# Patient Record
Sex: Male | Born: 1955 | Race: White | Hispanic: No | Marital: Married | Smoking: Never smoker
Health system: Southern US, Community
[De-identification: ages and names within clinical notes are randomized; demographics above are authoritative.]

## PROBLEM LIST (undated history)

## (undated) DIAGNOSIS — K219 Gastro-esophageal reflux disease without esophagitis: Secondary | ICD-10-CM

## (undated) DIAGNOSIS — R112 Nausea with vomiting, unspecified: Secondary | ICD-10-CM

## (undated) DIAGNOSIS — M109 Gout, unspecified: Secondary | ICD-10-CM

## (undated) DIAGNOSIS — L309 Dermatitis, unspecified: Secondary | ICD-10-CM

## (undated) DIAGNOSIS — R351 Nocturia: Secondary | ICD-10-CM

## (undated) DIAGNOSIS — M75101 Unspecified rotator cuff tear or rupture of right shoulder, not specified as traumatic: Secondary | ICD-10-CM

## (undated) DIAGNOSIS — S43439A Superior glenoid labrum lesion of unspecified shoulder, initial encounter: Secondary | ICD-10-CM

## (undated) DIAGNOSIS — Z973 Presence of spectacles and contact lenses: Secondary | ICD-10-CM

## (undated) DIAGNOSIS — M1990 Unspecified osteoarthritis, unspecified site: Secondary | ICD-10-CM

## (undated) DIAGNOSIS — Z8619 Personal history of other infectious and parasitic diseases: Secondary | ICD-10-CM

## (undated) DIAGNOSIS — J45990 Exercise induced bronchospasm: Secondary | ICD-10-CM

## (undated) DIAGNOSIS — I10 Essential (primary) hypertension: Secondary | ICD-10-CM

## (undated) DIAGNOSIS — Z9889 Other specified postprocedural states: Secondary | ICD-10-CM

## (undated) DIAGNOSIS — N400 Enlarged prostate without lower urinary tract symptoms: Secondary | ICD-10-CM

## (undated) DIAGNOSIS — J189 Pneumonia, unspecified organism: Secondary | ICD-10-CM

## (undated) HISTORY — PX: TOTAL KNEE ARTHROPLASTY: SHX125

## (undated) HISTORY — PX: OTHER SURGICAL HISTORY: SHX169

## (undated) HISTORY — PX: CYST REMOVAL LEG: SHX6280

## (undated) HISTORY — PX: CYSTOSCOPY WITH INSERTION OF UROLIFT: SHX6678

## (undated) HISTORY — PX: SHOULDER ARTHROSCOPY WITH ROTATOR CUFF REPAIR: SHX5685

---

## 1978-09-03 HISTORY — PX: KNEE ARTHROSCOPY: SUR90

## 1999-07-20 ENCOUNTER — Ambulatory Visit (HOSPITAL_COMMUNITY): Admission: RE | Admit: 1999-07-20 | Discharge: 1999-07-20 | Payer: Self-pay | Admitting: Gastroenterology

## 1999-11-04 ENCOUNTER — Ambulatory Visit (HOSPITAL_COMMUNITY): Admission: RE | Admit: 1999-11-04 | Discharge: 1999-11-04 | Payer: Self-pay | Admitting: Cardiovascular Disease

## 2011-09-15 ENCOUNTER — Other Ambulatory Visit: Payer: Self-pay | Admitting: Gastroenterology

## 2011-09-15 DIAGNOSIS — R1011 Right upper quadrant pain: Secondary | ICD-10-CM

## 2011-09-19 ENCOUNTER — Ambulatory Visit
Admission: RE | Admit: 2011-09-19 | Discharge: 2011-09-19 | Disposition: A | Discharge status: Home / Self Care | Payer: Commercial Managed Care - PPO | Source: Ambulatory Visit | Attending: Gastroenterology | Admitting: Gastroenterology

## 2011-09-19 DIAGNOSIS — R1011 Right upper quadrant pain: Secondary | ICD-10-CM

## 2011-11-28 ENCOUNTER — Other Ambulatory Visit: Payer: Self-pay | Admitting: Gastroenterology

## 2011-11-28 DIAGNOSIS — R197 Diarrhea, unspecified: Secondary | ICD-10-CM

## 2011-11-28 DIAGNOSIS — R1011 Right upper quadrant pain: Secondary | ICD-10-CM

## 2011-11-28 DIAGNOSIS — K8689 Other specified diseases of pancreas: Secondary | ICD-10-CM

## 2011-12-11 ENCOUNTER — Ambulatory Visit
Admission: RE | Admit: 2011-12-11 | Discharge: 2011-12-11 | Disposition: A | Discharge status: Home / Self Care | Payer: Commercial Managed Care - PPO | Source: Ambulatory Visit | Attending: Gastroenterology | Admitting: Gastroenterology

## 2011-12-11 DIAGNOSIS — K8689 Other specified diseases of pancreas: Secondary | ICD-10-CM

## 2011-12-11 DIAGNOSIS — R197 Diarrhea, unspecified: Secondary | ICD-10-CM

## 2011-12-11 DIAGNOSIS — R1011 Right upper quadrant pain: Secondary | ICD-10-CM

## 2011-12-11 MED ORDER — IOHEXOL 300 MG/ML  SOLN
100.0000 mL | Freq: Once | INTRAMUSCULAR | Status: AC | PRN
  Administered 2011-12-11: 100 mL via INTRAVENOUS

## 2013-08-31 IMAGING — CT CT ABD-PEL WO/W CM
1 of 8 series · 12 of 36 positions shown, 18 images · IV contrast (WATER & [ID] OMNI 300)
Comparison: Ultrasound of the abdomen of 09/19/2011

CLINICAL DATA: Right upper quadrant abdominal pain, slightly
echogenic focus within the tail of pancreas on ultrasound.
Diarrhea

CT ABDOMEN AND PELVIS WITHOUT AND WITH CONTRAST
TECHNIQUE: Multidetector CT imaging of the abdomen and pelvis was
performed without contrast material in one or both body regions,
followed by contrast material(s) and further sections in one or
both body regions.
Contrast: 100mL OMNIPAQUE IOHEXOL 300 MG/ML  SOLN

[Series 4: arterial/port venous (id) · axial · arterial · 0.72mm/px · z∈[-347,-14]mm · 12 of 316 slices shown, 18 images]
[im 25/316  soft-tissue]
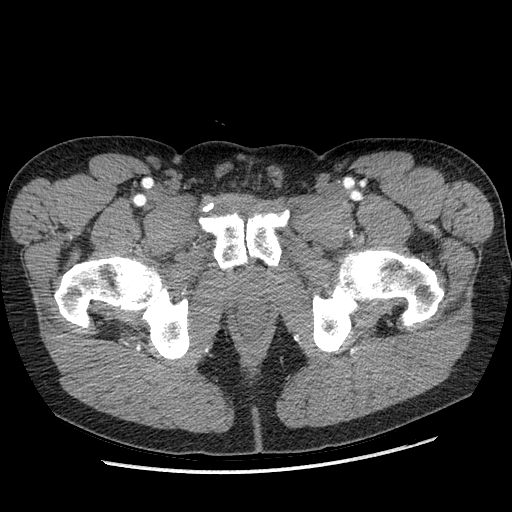
[im 25/316  bone]
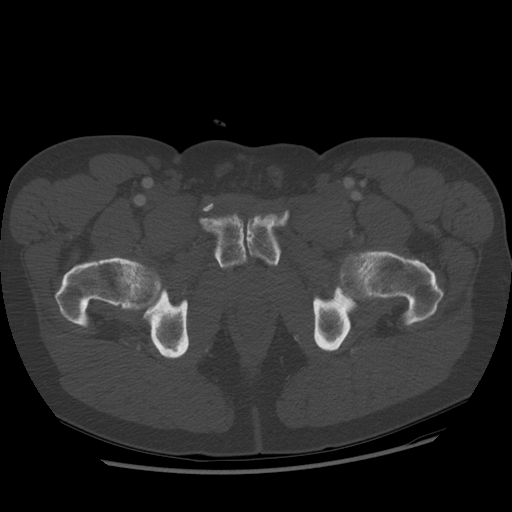
[im 49/316  soft-tissue]
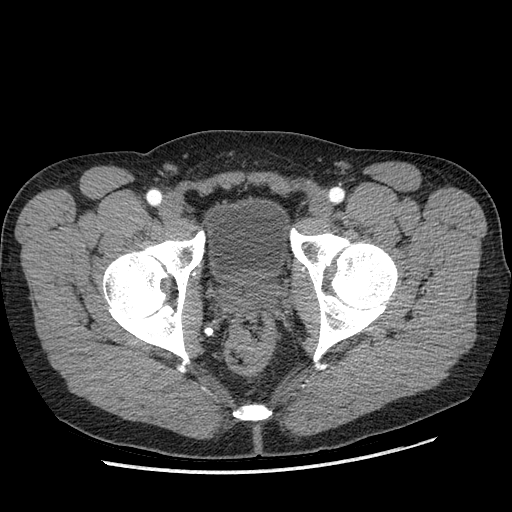
[im 73/316  soft-tissue]
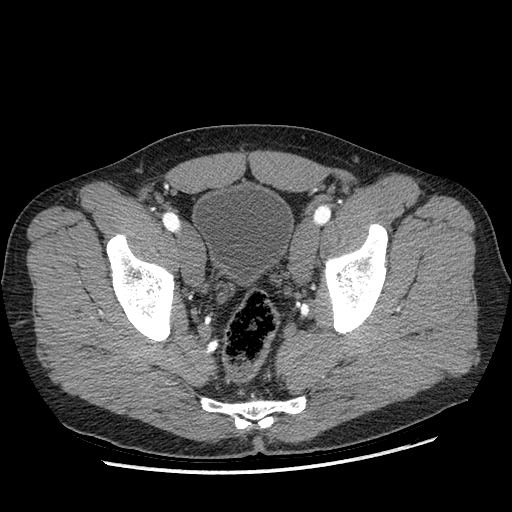
[im 97/316  soft-tissue]
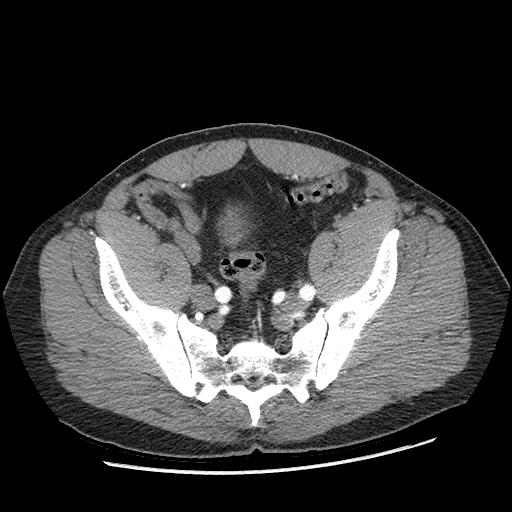
[im 122/316  soft-tissue]
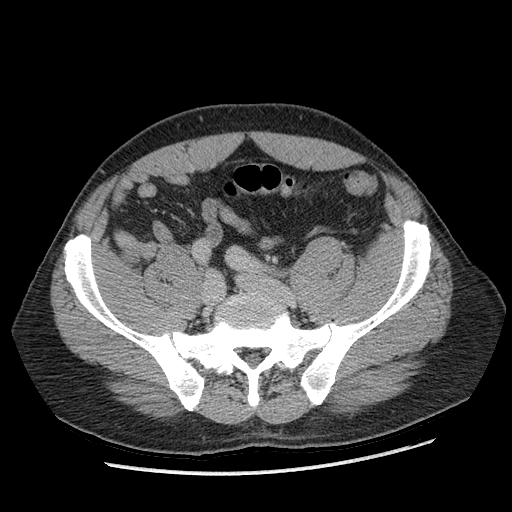
[im 146/316  soft-tissue]
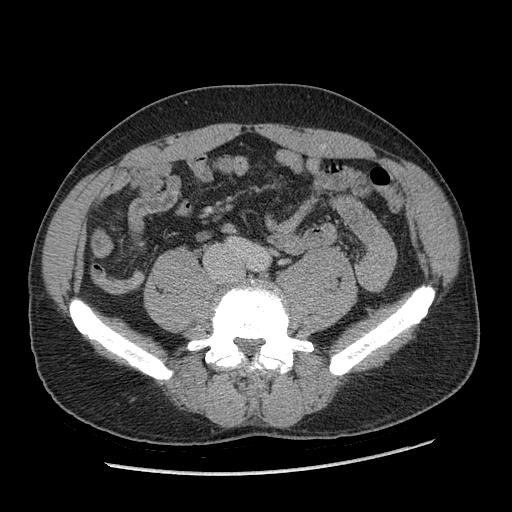
[im 170/316  soft-tissue]
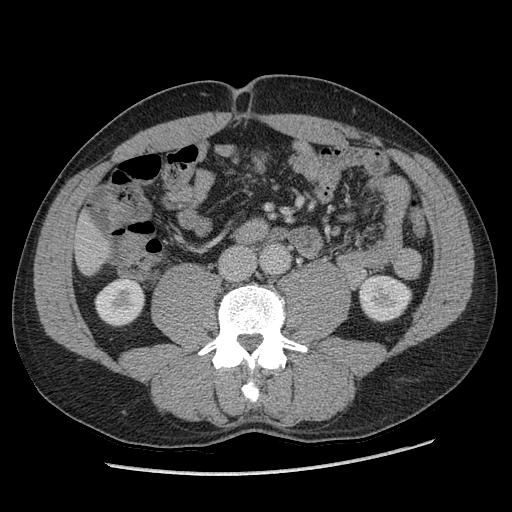
[im 194/316  soft-tissue]
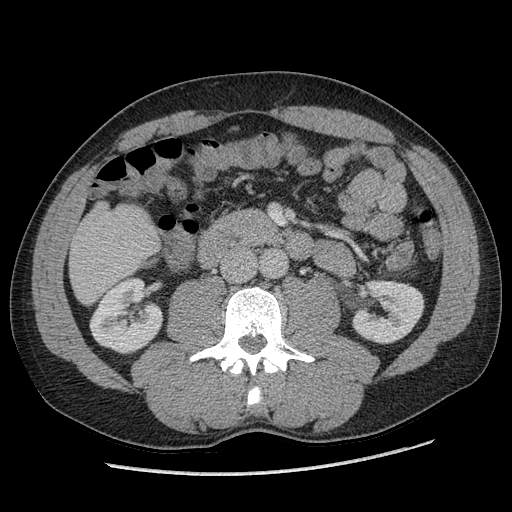
[im 219/316  soft-tissue]
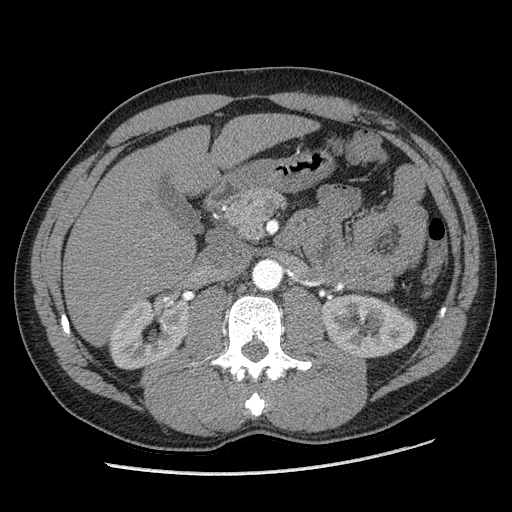
[im 219/316  lung]
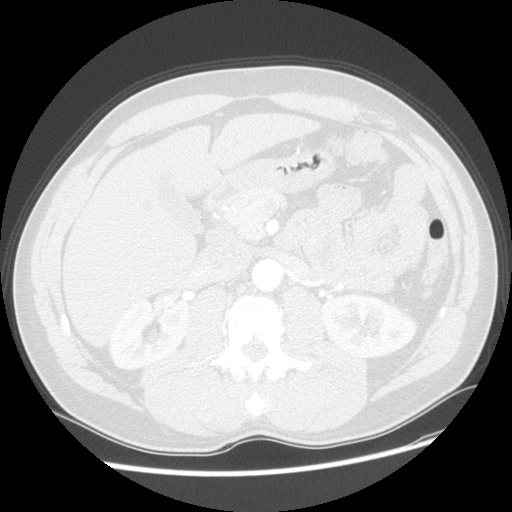
[im 219/316  bone]
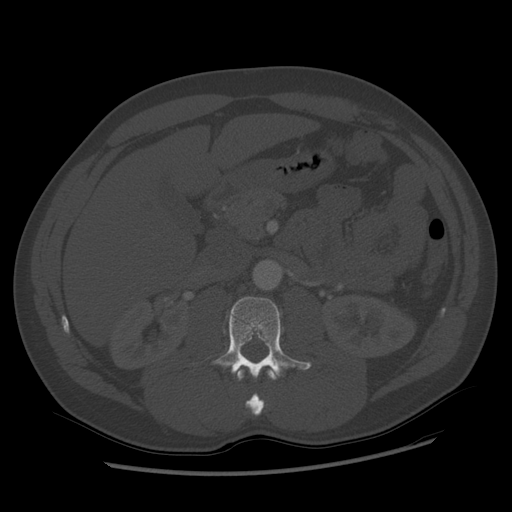
[im 243/316  soft-tissue]
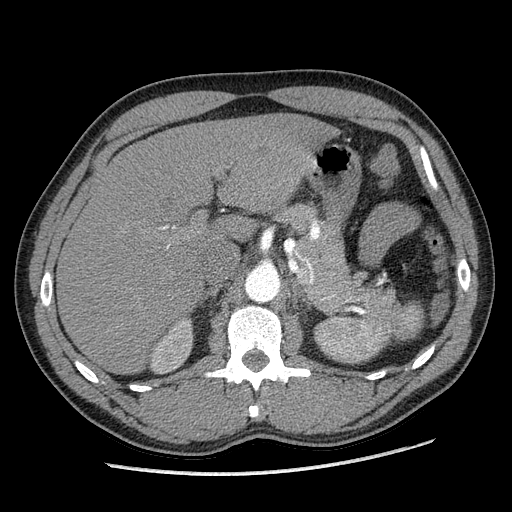
[im 243/316  lung]
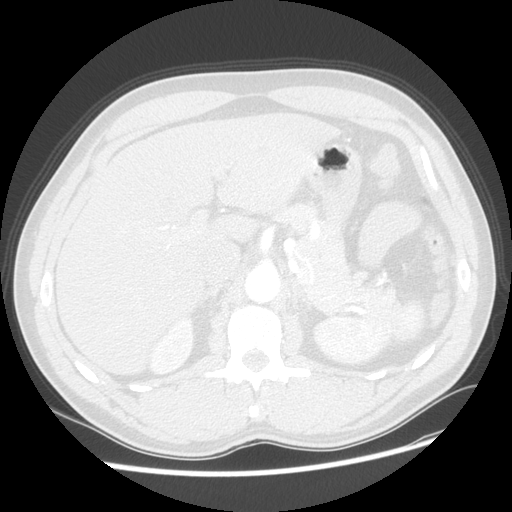
[im 267/316  soft-tissue]
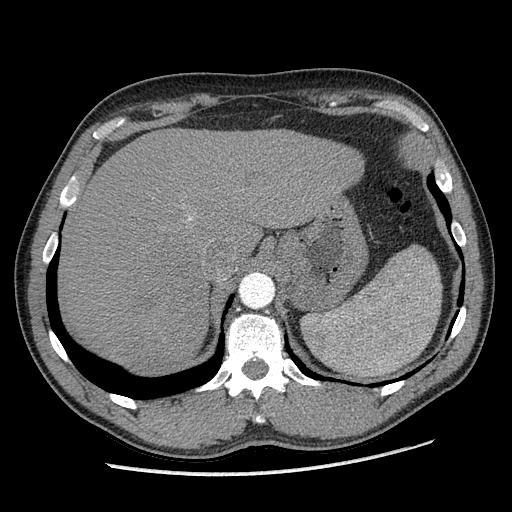
[im 267/316  lung]
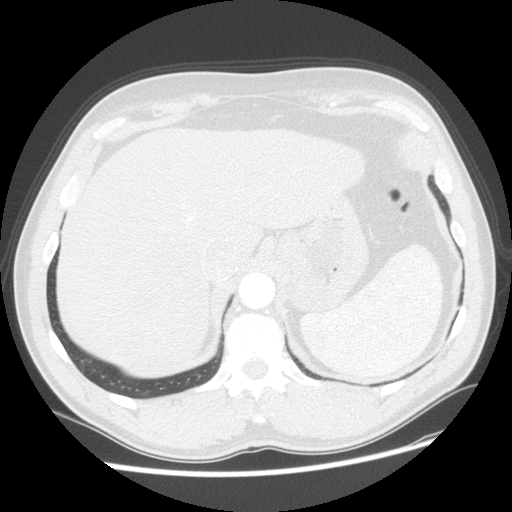
[im 291/316  soft-tissue]
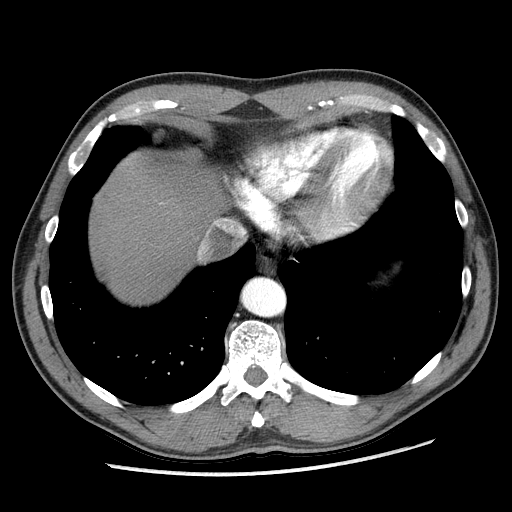
[im 291/316  lung]
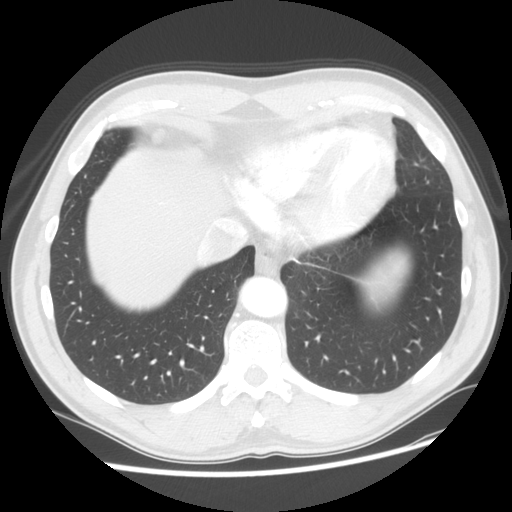

[12 of 36 positions shown; findings below may reference images not displayed]

FINDINGS: The lung bases are clear.  On the unenhanced study there
is mild cardiomegaly present.  There may also be a small hiatal
hernia.  There is a small nonobstructing right lower pole renal
calculus present.

On the arterial phase, no enhancing pancreatic lesion is seen.  The
liver enhances with no focal abnormality and no ductal dilatation
is seen.  No calcified gallstones are noted.  The pancreas
homogeneously enhances with no focal abnormality and no pancreatic
ductal dilatation is seen.  The origins of the celiac axis and SMA
appear patent.  The kidneys enhance with no suspicious abnormality
noted.  The abdominal aorta is normal in caliber.  No adenopathy is
seen.

On the portal venous phase, no pancreatic lesion is evident.  The
pancreatic duct is not dilated.

The urinary bladder is not well distended and is minimally thick-
walled.  The prostate is within normal limits in size.  No pelvic
mass or fluid is seen.  No abnormality of the colon is noted.  The
terminal ileum and the appendix are unremarkable.  On bone window
images there is some sclerosis along the SI joints which most
likely is degenerative in origin.  Sacroiliitis is difficult to
exclude. There is degenerative change throughout the facet joints
of the lower lumbar spine.  There is a questionable minimal pars
defect on the right at L5 but normal alignment of the lumbar
vertebrae is noted.
IMPRESSION: 1.  No pancreatic lesion is seen.
2.  No explanation for the patient's right upper quadrant pain is
noted.
3.  Small hiatal hernia.
4.  Mild cardiomegaly.
5.  Sclerosis along the SI joints most likely is degenerative in
origin.  Sacroiliitis cannot be excluded.

## 2015-05-26 ENCOUNTER — Other Ambulatory Visit: Payer: Self-pay | Admitting: Orthopedic Surgery

## 2015-05-26 ENCOUNTER — Encounter (HOSPITAL_BASED_OUTPATIENT_CLINIC_OR_DEPARTMENT_OTHER): Payer: Self-pay | Admitting: *Deleted

## 2015-05-26 NOTE — Progress Notes (Signed)
NPO AFTER MN, PT VERBALIZED UNDERSTANDING THIS INCLUDES NO DIP TOBACCO.   ARRIVE AT 0900.  NEEDS ISTAT AND EKG.  WILL TAKE RAPAFLO AND LOSARTAN AM DOS W/ SIPS OF WATER . 

## 2015-05-27 ENCOUNTER — Encounter (HOSPITAL_BASED_OUTPATIENT_CLINIC_OR_DEPARTMENT_OTHER): Payer: Self-pay | Admitting: Anesthesiology

## 2015-05-27 ENCOUNTER — Ambulatory Visit (HOSPITAL_BASED_OUTPATIENT_CLINIC_OR_DEPARTMENT_OTHER)
Admission: RE | Admit: 2015-05-27 | Discharge: 2015-05-27 | Disposition: A | Discharge status: Home / Self Care | Payer: Commercial Managed Care - PPO | Source: Ambulatory Visit | Attending: Specialist | Admitting: Specialist

## 2015-05-27 ENCOUNTER — Ambulatory Visit (HOSPITAL_BASED_OUTPATIENT_CLINIC_OR_DEPARTMENT_OTHER): Payer: Commercial Managed Care - PPO | Admitting: Anesthesiology

## 2015-05-27 ENCOUNTER — Encounter (HOSPITAL_BASED_OUTPATIENT_CLINIC_OR_DEPARTMENT_OTHER)
Admission: RE | Disposition: A | Discharge status: Home / Self Care | Payer: Self-pay | Source: Ambulatory Visit | Attending: Specialist

## 2015-05-27 DIAGNOSIS — I10 Essential (primary) hypertension: Secondary | ICD-10-CM | POA: Diagnosis not present

## 2015-05-27 DIAGNOSIS — M19011 Primary osteoarthritis, right shoulder: Secondary | ICD-10-CM | POA: Insufficient documentation

## 2015-05-27 DIAGNOSIS — X58XXXA Exposure to other specified factors, initial encounter: Secondary | ICD-10-CM | POA: Diagnosis not present

## 2015-05-27 DIAGNOSIS — M75121 Complete rotator cuff tear or rupture of right shoulder, not specified as traumatic: Secondary | ICD-10-CM | POA: Insufficient documentation

## 2015-05-27 DIAGNOSIS — Z96653 Presence of artificial knee joint, bilateral: Secondary | ICD-10-CM | POA: Diagnosis not present

## 2015-05-27 DIAGNOSIS — Z9889 Other specified postprocedural states: Secondary | ICD-10-CM

## 2015-05-27 DIAGNOSIS — S46111A Strain of muscle, fascia and tendon of long head of biceps, right arm, initial encounter: Secondary | ICD-10-CM | POA: Insufficient documentation

## 2015-05-27 DIAGNOSIS — M170 Bilateral primary osteoarthritis of knee: Secondary | ICD-10-CM | POA: Diagnosis not present

## 2015-05-27 DIAGNOSIS — S43401A Unspecified sprain of right shoulder joint, initial encounter: Secondary | ICD-10-CM | POA: Insufficient documentation

## 2015-05-27 DIAGNOSIS — M25519 Pain in unspecified shoulder: Secondary | ICD-10-CM | POA: Diagnosis present

## 2015-05-27 DIAGNOSIS — F17290 Nicotine dependence, other tobacco product, uncomplicated: Secondary | ICD-10-CM | POA: Insufficient documentation

## 2015-05-27 HISTORY — DX: Nocturia: R35.1

## 2015-05-27 HISTORY — PX: SHOULDER ARTHROSCOPY WITH ROTATOR CUFF REPAIR AND SUBACROMIAL DECOMPRESSION: SHX5686

## 2015-05-27 HISTORY — DX: Exercise induced bronchospasm: J45.990

## 2015-05-27 HISTORY — DX: Dermatitis, unspecified: L30.9

## 2015-05-27 HISTORY — DX: Unspecified rotator cuff tear or rupture of right shoulder, not specified as traumatic: M75.101

## 2015-05-27 HISTORY — DX: Benign prostatic hyperplasia without lower urinary tract symptoms: N40.0

## 2015-05-27 HISTORY — DX: Unspecified osteoarthritis, unspecified site: M19.90

## 2015-05-27 HISTORY — DX: Presence of spectacles and contact lenses: Z97.3

## 2015-05-27 HISTORY — DX: Nausea with vomiting, unspecified: R11.2

## 2015-05-27 HISTORY — DX: Essential (primary) hypertension: I10

## 2015-05-27 HISTORY — DX: Superior glenoid labrum lesion of unspecified shoulder, initial encounter: S43.439A

## 2015-05-27 HISTORY — DX: Gout, unspecified: M10.9

## 2015-05-27 HISTORY — DX: Personal history of other infectious and parasitic diseases: Z86.19

## 2015-05-27 HISTORY — DX: Other specified postprocedural states: Z98.890

## 2015-05-27 LAB — POCT I-STAT 4, (NA,K, GLUC, HGB,HCT)
Glucose, Bld: 104 mg/dL — ABNORMAL HIGH (ref 65–99)
HCT: 46 % (ref 39.0–52.0)
HEMOGLOBIN: 15.6 g/dL (ref 13.0–17.0)
Potassium: 4.5 mmol/L (ref 3.5–5.1)
SODIUM: 140 mmol/L (ref 135–145)

## 2015-05-27 SURGERY — SHOULDER ARTHROSCOPY WITH ROTATOR CUFF REPAIR AND SUBACROMIAL DECOMPRESSION
Anesthesia: General | Site: Shoulder | Laterality: Right

## 2015-05-27 MED ORDER — ONDANSETRON HCL 4 MG/2ML IJ SOLN
INTRAMUSCULAR | Status: AC
  Filled 2015-05-27: qty 2

## 2015-05-27 MED ORDER — PROPOFOL 10 MG/ML IV BOLUS
INTRAVENOUS | Status: DC | PRN
  Administered 2015-05-27: 160 mg via INTRAVENOUS

## 2015-05-27 MED ORDER — CEFAZOLIN SODIUM-DEXTROSE 2-4 GM/100ML-% IV SOLN
2.0000 g | INTRAVENOUS | Status: AC
  Administered 2015-05-27: 2 g via INTRAVENOUS
  Filled 2015-05-27: qty 100

## 2015-05-27 MED ORDER — LACTATED RINGERS IV SOLN
INTRAVENOUS | Status: DC
Start: 2015-05-27 — End: 2015-05-27
  Administered 2015-05-27 (×2): via INTRAVENOUS
  Filled 2015-05-27: qty 1000

## 2015-05-27 MED ORDER — FENTANYL CITRATE (PF) 100 MCG/2ML IJ SOLN
25.0000 ug | Freq: Once | INTRAMUSCULAR | Status: AC
  Administered 2015-05-27: 25 ug via INTRAVENOUS
  Filled 2015-05-27: qty 0.5

## 2015-05-27 MED ORDER — BUPIVACAINE HCL (PF) 0.25 % IJ SOLN: INTRAMUSCULAR | Status: DC | PRN

## 2015-05-27 MED ORDER — FENTANYL CITRATE (PF) 100 MCG/2ML IJ SOLN
INTRAMUSCULAR | Status: AC
  Filled 2015-05-27: qty 2

## 2015-05-27 MED ORDER — ONDANSETRON HCL 4 MG/2ML IJ SOLN
INTRAMUSCULAR | Status: DC | PRN
  Administered 2015-05-27: 4 mg via INTRAVENOUS

## 2015-05-27 MED ORDER — PROPOFOL 10 MG/ML IV BOLUS
INTRAVENOUS | Status: AC
  Filled 2015-05-27: qty 40

## 2015-05-27 MED ORDER — PROMETHAZINE HCL 25 MG/ML IJ SOLN
6.2500 mg | INTRAMUSCULAR | Status: DC | PRN
  Filled 2015-05-27: qty 1

## 2015-05-27 MED ORDER — METHOCARBAMOL 500 MG PO TABS: 500.0000 mg | ORAL_TABLET | Freq: Four times a day (QID) | ORAL | Status: DC | PRN

## 2015-05-27 MED ORDER — DEXAMETHASONE SODIUM PHOSPHATE 4 MG/ML IJ SOLN
INTRAMUSCULAR | Status: DC | PRN
  Administered 2015-05-27: 10 mg via INTRAVENOUS

## 2015-05-27 MED ORDER — MIDAZOLAM HCL 2 MG/2ML IJ SOLN
INTRAMUSCULAR | Status: AC
  Filled 2015-05-27: qty 2

## 2015-05-27 MED ORDER — KETOROLAC TROMETHAMINE 30 MG/ML IJ SOLN
INTRAMUSCULAR | Status: AC
  Filled 2015-05-27: qty 1

## 2015-05-27 MED ORDER — OXYCODONE-ACETAMINOPHEN 5-325 MG PO TABS: 1.0000 | ORAL_TABLET | ORAL | Status: DC | PRN

## 2015-05-27 MED ORDER — DEXAMETHASONE SODIUM PHOSPHATE 10 MG/ML IJ SOLN
INTRAMUSCULAR | Status: AC
  Filled 2015-05-27: qty 1

## 2015-05-27 MED ORDER — SUCCINYLCHOLINE CHLORIDE 20 MG/ML IJ SOLN
INTRAMUSCULAR | Status: DC | PRN
  Administered 2015-05-27: 100 mg via INTRAVENOUS

## 2015-05-27 MED ORDER — BUPIVACAINE-EPINEPHRINE (PF) 0.5% -1:200000 IJ SOLN
INTRAMUSCULAR | Status: DC | PRN
  Administered 2015-05-27: 25 mL

## 2015-05-27 MED ORDER — CEFAZOLIN SODIUM-DEXTROSE 2-4 GM/100ML-% IV SOLN
INTRAVENOUS | Status: AC
  Filled 2015-05-27: qty 100

## 2015-05-27 MED ORDER — SODIUM CHLORIDE 0.9 % IR SOLN
Status: DC | PRN
  Administered 2015-05-27: 24000 mL

## 2015-05-27 MED ORDER — CEPHALEXIN 500 MG PO CAPS
500.0000 mg | ORAL_CAPSULE | Freq: Three times a day (TID) | ORAL | Status: DC
Start: 2015-05-27 — End: 2020-06-23

## 2015-05-27 MED ORDER — BUPIVACAINE-EPINEPHRINE (PF) 0.5% -1:200000 IJ SOLN
INTRAMUSCULAR | Status: AC
  Filled 2015-05-27: qty 30

## 2015-05-27 MED ORDER — MIDAZOLAM HCL 2 MG/2ML IJ SOLN
2.0000 mg | Freq: Once | INTRAMUSCULAR | Status: AC
  Administered 2015-05-27: 2 mg via INTRAVENOUS
  Filled 2015-05-27: qty 2

## 2015-05-27 MED ORDER — FENTANYL CITRATE (PF) 100 MCG/2ML IJ SOLN
25.0000 ug | INTRAMUSCULAR | Status: DC | PRN
  Filled 2015-05-27: qty 1

## 2015-05-27 MED ORDER — FENTANYL CITRATE (PF) 100 MCG/2ML IJ SOLN
INTRAMUSCULAR | Status: DC | PRN
  Administered 2015-05-27: 100 ug via INTRAVENOUS

## 2015-05-27 MED ORDER — POVIDONE-IODINE 7.5 % EX SOLN
Freq: Once | CUTANEOUS | Status: DC
Start: 2015-05-27 — End: 2015-05-27
  Filled 2015-05-27: qty 118

## 2015-05-27 MED ORDER — SODIUM CHLORIDE 0.9 % IJ SOLN
INTRAMUSCULAR | Status: DC | PRN
  Administered 2015-05-27: 12:00:00

## 2015-05-27 MED ORDER — EPHEDRINE SULFATE 50 MG/ML IJ SOLN
INTRAMUSCULAR | Status: DC | PRN
  Administered 2015-05-27: 10 mg via INTRAVENOUS

## 2015-05-27 MED ORDER — LIDOCAINE HCL (CARDIAC) 20 MG/ML IV SOLN
INTRAVENOUS | Status: AC
  Filled 2015-05-27: qty 5

## 2015-05-27 MED ORDER — LIDOCAINE HCL (CARDIAC) 20 MG/ML IV SOLN
INTRAVENOUS | Status: DC | PRN
  Administered 2015-05-27: 90 mg via INTRAVENOUS

## 2015-05-27 SURGICAL SUPPLY — 96 items
ANCH SUT SWLK 19.1 CLS EYLT TL (Anchor) ×1 IMPLANT
ANCH SUT SWLK 19.1X4.75 (Anchor) ×1 IMPLANT
ANCHOR SUT BIO SW 4.75 W/FIB (Anchor) ×2 IMPLANT
ANCHOR SUT BIO SW 4.75X19.1 (Anchor) ×2 IMPLANT
BLADE CUDA GRT WHITE 3.5 (BLADE) ×3 IMPLANT
BLADE CUTTER GATOR 3.5 (BLADE) IMPLANT
BLADE GREAT WHITE 4.2 (BLADE) ×2 IMPLANT
BLADE GREAT WHITE 4.2MM (BLADE) ×1
BLADE SURG 11 STRL SS (BLADE) ×3 IMPLANT
BLADE SURG 15 STRL LF DISP TIS (BLADE) ×1 IMPLANT
BLADE SURG 15 STRL SS (BLADE) ×3
BUR 3.5 LG SPHERICAL (BURR) IMPLANT
BUR OVAL 6.0 (BURR) ×3 IMPLANT
BURR 3.5 LG SPHERICAL (BURR)
BURR 3.5MM LG SPHERICAL (BURR)
CANISTER SUCTION 2500CC (MISCELLANEOUS) IMPLANT
CANNULA 5.75X7 CRYSTAL CLEAR (CANNULA) ×3 IMPLANT
CANNULA 5.75X71 LONG (CANNULA) IMPLANT
CANNULA TWIST IN 8.25X7CM (CANNULA) ×8 IMPLANT
COVER BACK TABLE 60X90IN (DRAPES) ×3 IMPLANT
COVER MAYO STAND STRL (DRAPES) ×3 IMPLANT
DRAPE LG THREE QUARTER DISP (DRAPES) IMPLANT
DRAPE ORTHO SPLIT 77X108 STRL (DRAPES) ×6
DRAPE POUCH INSTRU U-SHP 10X18 (DRAPES) ×3 IMPLANT
DRAPE STERI 35X30 U-POUCH (DRAPES) ×3 IMPLANT
DRAPE SURG 17X23 STRL (DRAPES) ×3 IMPLANT
DRAPE SURG ORHT 6 SPLT 77X108 (DRAPES) ×2 IMPLANT
DRAPE U-SHAPE 47X51 STRL (DRAPES) ×3 IMPLANT
DRSG PAD ABDOMINAL 8X10 ST (GAUZE/BANDAGES/DRESSINGS) ×2 IMPLANT
DURAPREP 26ML APPLICATOR (WOUND CARE) ×3 IMPLANT
ELECT MENISCUS 165MM 90D (ELECTRODE) ×2 IMPLANT
ELECT REM PT RETURN 9FT ADLT (ELECTROSURGICAL) ×3
ELECTRODE REM PT RTRN 9FT ADLT (ELECTROSURGICAL) ×1 IMPLANT
FIBERSTICK 2 (SUTURE) ×6 IMPLANT
GAUZE XEROFORM 1X8 LF (GAUZE/BANDAGES/DRESSINGS) ×3 IMPLANT
GLOVE BIO SURGEON STRL SZ 6.5 (GLOVE) ×1 IMPLANT
GLOVE BIO SURGEON STRL SZ7.5 (GLOVE) ×3 IMPLANT
GLOVE BIO SURGEON STRL SZ8 (GLOVE) ×6 IMPLANT
GLOVE BIO SURGEONS STRL SZ 6.5 (GLOVE) ×1
GLOVE BIOGEL PI IND STRL 6.5 (GLOVE) IMPLANT
GLOVE BIOGEL PI IND STRL 7.5 (GLOVE) IMPLANT
GLOVE BIOGEL PI INDICATOR 6.5 (GLOVE) ×4
GLOVE BIOGEL PI INDICATOR 7.5 (GLOVE) ×2
GLOVE ECLIPSE 6.0 STRL STRAW (GLOVE) ×2 IMPLANT
GLOVE INDICATOR 8.0 STRL GRN (GLOVE) ×3 IMPLANT
GOWN STRL REUS W/ TWL LRG LVL3 (GOWN DISPOSABLE) ×1 IMPLANT
GOWN STRL REUS W/ TWL XL LVL3 (GOWN DISPOSABLE) ×2 IMPLANT
GOWN STRL REUS W/TWL LRG LVL3 (GOWN DISPOSABLE) ×6
GOWN STRL REUS W/TWL XL LVL3 (GOWN DISPOSABLE) ×6
IV NS IRRIG 3000ML ARTHROMATIC (IV SOLUTION) ×16 IMPLANT
KIT ROOM TURNOVER WOR (KITS) ×3 IMPLANT
LASSO SUT 90 DEGREE (SUTURE) IMPLANT
MANIFOLD NEPTUNE II (INSTRUMENTS) IMPLANT
NDL 1/2 CIR CATGUT .05X1.09 (NEEDLE) IMPLANT
NDL SCORPION MULTI FIRE (NEEDLE) IMPLANT
NDL SPNL 18GX3.5 QUINCKE PK (NEEDLE) ×1 IMPLANT
NEEDLE 1/2 CIR CATGUT .05X1.09 (NEEDLE) IMPLANT
NEEDLE HYPO 22GX1.5 SAFETY (NEEDLE) ×3 IMPLANT
NEEDLE SCORPION MULTI FIRE (NEEDLE) ×3 IMPLANT
NEEDLE SPNL 18GX3.5 QUINCKE PK (NEEDLE) ×3 IMPLANT
NS IRRIG 500ML POUR BTL (IV SOLUTION) IMPLANT
PACK BASIN DAY SURGERY FS (CUSTOM PROCEDURE TRAY) ×3 IMPLANT
PAD ABD 8X10 STRL (GAUZE/BANDAGES/DRESSINGS) ×6 IMPLANT
PAD ARMBOARD 7.5X6 YLW CONV (MISCELLANEOUS) ×2 IMPLANT
PENCIL BUTTON HOLSTER BLD 10FT (ELECTRODE) IMPLANT
SET ARTHROSCOPY TUBING (MISCELLANEOUS) ×3
SET ARTHROSCOPY TUBING PVC (MISCELLANEOUS) ×1 IMPLANT
SLEEVE ARM SUSPENSION SYSTEM (MISCELLANEOUS) ×3 IMPLANT
SLING ULTRA II AB L (ORTHOPEDIC SUPPLIES) IMPLANT
SLING ULTRA II AB S (ORTHOPEDIC SUPPLIES) IMPLANT
SPONGE GAUZE 4X4 12PLY (GAUZE/BANDAGES/DRESSINGS) ×3 IMPLANT
SPONGE GAUZE 4X4 12PLY STER LF (GAUZE/BANDAGES/DRESSINGS) ×2 IMPLANT
SPONGE LAP 4X18 X RAY DECT (DISPOSABLE) IMPLANT
SUCTION FRAZIER HANDLE 10FR (MISCELLANEOUS)
SUCTION TUBE FRAZIER 10FR DISP (MISCELLANEOUS) IMPLANT
SUT 2 FIBERLOOP 20 STRT BLUE (SUTURE)
SUT ETHILON 3 0 PS 1 (SUTURE) ×3 IMPLANT
SUT FIBERWIRE #2 38 T-5 BLUE (SUTURE)
SUT LASSO 45 DEGREE LEFT (SUTURE) IMPLANT
SUT LASSO 45D RIGHT (SUTURE) IMPLANT
SUT PDS AB 0 CT1 36 (SUTURE) IMPLANT
SUT TIGER TAPE 7 IN WHITE (SUTURE) IMPLANT
SUT VIC AB 0 CT1 36 (SUTURE) IMPLANT
SUT VIC AB 2-0 CT1 27 (SUTURE)
SUT VIC AB 2-0 CT1 TAPERPNT 27 (SUTURE) IMPLANT
SUTURE 2 FIBERLOOP 20 STRT BLU (SUTURE) IMPLANT
SUTURE FIBERWR #2 38 T-5 BLUE (SUTURE) IMPLANT
SYR 20CC LL (SYRINGE) ×3 IMPLANT
SYR CONTROL 10ML LL (SYRINGE) IMPLANT
TAPE CLOTH SURG 6X10 WHT LF (GAUZE/BANDAGES/DRESSINGS) ×2 IMPLANT
TOWEL OR 17X24 6PK STRL BLUE (TOWEL DISPOSABLE) ×7 IMPLANT
TUBE CONNECTING 12'X1/4 (SUCTIONS) ×2
TUBE CONNECTING 12X1/4 (SUCTIONS) ×4 IMPLANT
WAND 90 DEG TURBOVAC W/CORD (SURGICAL WAND) ×3 IMPLANT
WATER STERILE IRR 500ML POUR (IV SOLUTION) ×3 IMPLANT
YANKAUER SUCT BULB TIP NO VENT (SUCTIONS) IMPLANT

## 2015-05-27 NOTE — Anesthesia Procedure Notes (Addendum)
Anesthesia Regional Block:  Interscalene brachial plexus block  Pre-Anesthetic Checklist: ,, timeout performed, Correct Patient, Correct Site, Correct Laterality, Correct Procedure, Correct Position, site marked, Risks and benefits discussed, Surgical consent,  At surgeon's request and post-op pain management  Laterality: Right and Upper  Prep: chloraprep       Needles:  Injection technique: Single-shot  Needle Type: Stimulator Needle - 80      Needle Gauge: 21 and 21 G    Additional Needles:  Procedures: ultrasound guided (picture in chart) and nerve stimulator Interscalene brachial plexus block  Nerve Stimulator or Paresthesia:  Response: shoulder,   Additional Responses:   Narrative:  Injection made incrementally with aspirations every 5 mL.  Performed by: Personally  Anesthesiologist: Sherrian DiversENENNY, BRUCE  Additional Notes: No pain and no increased resistance to injection. Tolerated well. Loss of deltoid function at 20 minutes.   Procedure Name: Intubation Date/Time: 05/27/2015 12:00 PM Performed by: Maris BergerENENNY, Senaida Chilcote T Pre-anesthesia Checklist: Patient identified, Emergency Drugs available, Suction available and Patient being monitored Patient Re-evaluated:Patient Re-evaluated prior to inductionOxygen Delivery Method: Circle System Utilized Preoxygenation: Pre-oxygenation with 100% oxygen Intubation Type: IV induction Ventilation: Mask ventilation without difficulty Laryngoscope Size: Mac and 4 Grade View: Grade III Tube type: Oral Tube size: 7.0 mm Number of attempts: 1 Airway Equipment and Method: Stylet and Oral airway Placement Confirmation: ETT inserted through vocal cords under direct vision,  positive ETCO2 and breath sounds checked- equal and bilateral Tube secured with: Tape Dental Injury: Teeth and Oropharynx as per pre-operative assessment

## 2015-05-27 NOTE — Interval H&P Note (Signed)
History and Physical Interval Note:  05/27/2015 11:39 AM  Nadara EatonGary D Dutson  has presented today for surgery, with the diagnosis of right shoulder rotator cuff tear, AC Osteoarthritis  Labral Tear  The various methods of treatment have been discussed with the patient and family. After consideration of risks, benefits and other options for treatment, the patient has consented to  Procedure(s): RIGHT SHOULDER ARTHROSCOPY WITH DEBRIDEMENT, ROTATOR CUFF REPAIR AND SUBACROMIAL DECOMPRESSION, DISTAL CLAVICLE RESECTION AND POSSIBLE SUPERIOR CAPUSULAR RECONSTRUCTION (Right) as a surgical intervention .  The patient's history has been reviewed, patient examined, no change in status, stable for surgery.  I have reviewed the patient's chart and labs.  Questions were answered to the patient's satisfaction.     Court Gracia ANDREW

## 2015-05-27 NOTE — Anesthesia Preprocedure Evaluation (Addendum)
Anesthesia Evaluation  Patient identified by MRN, date of birth, ID band Patient awake    Reviewed: Allergy & Precautions, NPO status , Patient's Chart, lab work & pertinent test results  History of Anesthesia Complications (+) PONV and history of anesthetic complications  Airway Mallampati: II  TM Distance: >3 FB Neck ROM: Full    Dental no notable dental hx.    Pulmonary asthma ,    Pulmonary exam normal breath sounds clear to auscultation       Cardiovascular hypertension, Pt. on medications Normal cardiovascular exam Rhythm:Regular Rate:Normal     Neuro/Psych negative neurological ROS  negative psych ROS   GI/Hepatic negative GI ROS, (+) Hepatitis -, B, C  Endo/Other  negative endocrine ROS  Renal/GU negative Renal ROS  negative genitourinary   Musculoskeletal  (+) Arthritis ,   Abdominal   Peds negative pediatric ROS (+)  Hematology negative hematology ROS (+)   Anesthesia Other Findings   Reproductive/Obstetrics negative OB ROS                            Anesthesia Physical Anesthesia Plan  ASA: II  Anesthesia Plan: General   Post-op Pain Management: GA combined w/ Regional for post-op pain   Induction: Intravenous  Airway Management Planned: Oral ETT  Additional Equipment:   Intra-op Plan:   Post-operative Plan: Extubation in OR  Informed Consent: I have reviewed the patients History and Physical, chart, labs and discussed the procedure including the risks, benefits and alternatives for the proposed anesthesia with the patient or authorized representative who has indicated his/her understanding and acceptance.   Dental advisory given  Plan Discussed with: CRNA  Anesthesia Plan Comments: (Discussed risks and benefits of interscalene block including failure, bleeding, infection, nerve damage, weakness. Questions answered. Patient consents to block.)        Anesthesia Quick Evaluation

## 2015-05-27 NOTE — H&P (View-Only) (Signed)
NPO AFTER MN, PT VERBALIZED UNDERSTANDING THIS INCLUDES NO DIP TOBACCO.   ARRIVE AT 0900.  NEEDS ISTAT AND EKG.  WILL TAKE RAPAFLO AND LOSARTAN AM DOS W/ SIPS OF WATER .

## 2015-05-27 NOTE — Transfer of Care (Signed)
Immediate Anesthesia Transfer of Care Note  Patient: Terry EatonGary D Hagwood  Procedure(s) Performed: Procedure(s): RIGHT SHOULDER ARTHROSCOPY WITH DEBRIDEMENT, ROTATOR CUFF REPAIR AND SUBACROMIAL DECOMPRESSION, DISTAL CLAVICLE RESECTION AND POSSIBLE SUPERIOR CAPUSULAR RECONSTRUCTION (Right) SHOULDER ARTHROSCOPY WITH ROTATOR CUFF REPAIR SUBACROMIAL DECOMPRESSION  Patient Location: PACU  Anesthesia Type:General  Level of Consciousness: awake, alert  and oriented  Airway & Oxygen Therapy: Patient Spontanous Breathing and Patient connected to nasal cannula oxygen  Post-op Assessment: Report given to RN  Post vital signs: Reviewed and stable  Last Vitals:118/63  , 52, 16, 100%, 97.6 Filed Vitals:   05/27/15 0857  BP: 113/71  Pulse: 44  Temp: 36.6 C  Resp: 14    Last Pain: There were no vitals filed for this visit.       Complications: No apparent anesthesia complications

## 2015-05-27 NOTE — Anesthesia Postprocedure Evaluation (Signed)
Anesthesia Post Note  Patient: Terry Graham  Procedure(s) Performed: Procedure(s) (LRB): RIGHT SHOULDER ARTHROSCOPY WITH DEBRIDEMENT, ROTATOR CUFF REPAIR AND SUBACROMIAL DECOMPRESSION, DISTAL CLAVICLE RESECTION (Right)  Patient location during evaluation: PACU Anesthesia Type: General Level of consciousness: awake Pain management: pain level controlled Vital Signs Assessment: post-procedure vital signs reviewed and stable Respiratory status: spontaneous breathing Cardiovascular status: blood pressure returned to baseline Anesthetic complications: no    Last Vitals:  Filed Vitals:   05/27/15 1430 05/27/15 1445  BP: 126/68 120/73  Pulse: 49 50  Temp:    Resp: 13 18    Last Pain:  Filed Vitals:   05/27/15 1448  PainSc: 0-No pain                 EDWARDS,Kaliegh Willadsen

## 2015-05-27 NOTE — Op Note (Signed)
Preoperative diagnosis right shoulder massive rotator cuff tear, labral tears, before meals arthritis. Postop diagnosis same plus advanced glenohumeral osteoarthritis she will head and glenoid Procedure right shoulder arthroscopy labral debridement, arthroscopic subacromial decompression, acromioplasty bursectomy CA ligament release, arthroscopic distal clavicle resection Mumford procedure, arthroscopic mobilization and repair of rotator cuff. Surgeon Valma CavaAndrew Braelyn Bordonaro, Asst. Marciano SequinBryson Stillwell PA-C Anesthesia interscalene block general Estimated blood loss minimal Drains none Complications none Disposition PACU stable  Patient was counseled in the holding area. Interscalene block administered by anesthesia. IV antibiotics are given. Taken to the operating room placed in supine position under general anesthesia. Turned to a left lateral decubitus position probably padded and bumped. Right shoulder revealed full range of motion stable. Prepped with DuraPrep and draped into a sterile fashion. Overhead shoulder positioner was utilized at 30 abduction 10 of 4 flexion between 40 and 50 of abduction. Elijah Birkom out was done from the right side arthroscopic portal was established posteriorly arthroscope placed into the glenohumeral joint easily identifiable was circumferential Labral tear and chronic long head biceps rupture with intra-articular.. Massive rotator cuff tear involving the entire supraspinatus and majority infraspinatus retraction. Lateral portal was established neurovascular structures were protected Nerve induced labral tear with rotator cuff the tissues go down with time. Positive painful, she will glenoid was advanced. She was immobilized with intra-articular and subacromial and to the point where the posterior exchange could be closed with margin convergence sutures and the convert that to a bone tendon repair the cautery was utilized to soft tissues from the humeral head and the acromion periosteum CA  ligament released burs and placed posteriorly an anterior inferior lateral acromioplasty was performed convert to a type I acromion morphology. Before meals joint found be markedly osteoarthritic with loss of articular cartilage and subchondral cyst into portal was made without outside in technique shaver was introduced. Soft tissue burs induced and dissected 58 mm of the clavicle circumferentially making sure that the superior capsule intact clavicle palpated found be stable debris was moved was placed supine positive the burn convert the greater tuberosity of parasite, bleeding bone. Created posteriorly medially. Proceeding anterolaterally multiple margin convergent sutures were placed bringing the infraspinatus portion anteriorly and laterally and closed the posterior aspect. Small puncture was made superiorly and Arthrex bio composite anchor placed appropriate angle for sutures in place and the cuff 2512 fiber tape was abductor for this above days for suture technique. Humeral head was satisfactory tissue. Was placed without excessive tension. Chest mobilization. With intraoperative ureteroscopically removed. Normal pulses at the wrist in the case the portal closed one suture 2 mL of Synvisc) scan stertorous 5 (  Laid supine awakened and taken operating room PACU  In stable condition. He restabilize impacted discharge to home  Help with patient positioning prepping and draping technical and surgical assistance throughout the entire case Mr. Marciano SequinBryson Stillwell PA-C is

## 2015-05-27 NOTE — Discharge Instructions (Signed)
°  Post Anesthesia Home Care Instructions ° °Activity: °Get plenty of rest for the remainder of the day. A responsible adult should stay with you for 24 hours following the procedure.  °For the next 24 hours, DO NOT: °-Drive a car °-Operate machinery °-Drink alcoholic beverages °-Take any medication unless instructed by your physician °-Make any legal decisions or sign important papers. ° °Meals: °Start with liquid foods such as gelatin or soup. Progress to regular foods as tolerated. Avoid greasy, spicy, heavy foods. If nausea and/or vomiting occur, drink only clear liquids until the nausea and/or vomiting subsides. Call your physician if vomiting continues. ° °Special Instructions/Symptoms: °Your throat may feel dry or sore from the anesthesia or the breathing tube placed in your throat during surgery. If this causes discomfort, gargle with warm salt water. The discomfort should disappear within 24 hours. ° °If you had a scopolamine patch placed behind your ear for the management of post- operative nausea and/or vomiting: ° °1. The medication in the patch is effective for 72 hours, after which it should be removed.  Wrap patch in a tissue and discard in the trash. Wash hands thoroughly with soap and water. °2. You may remove the patch earlier than 72 hours if you experience unpleasant side effects which may include dry mouth, dizziness or visual disturbances. °3. Avoid touching the patch. Wash your hands with soap and water after contact with the patch. °  °Regional Anesthesia Blocks ° °1. Numbness or the inability to move the "blocked" extremity may last from 3-48 hours after placement. The length of time depends on the medication injected and your individual response to the medication. If the numbness is not going away after 48 hours, call your surgeon. ° °2. The extremity that is blocked will need to be protected until the numbness is gone and the  Strength has returned. Because you cannot feel it, you will need  to take extra care to avoid injury. Because it may be weak, you may have difficulty moving it or using it. You may not know what position it is in without looking at it while the block is in effect. ° °3. For blocks in the legs and feet, returning to weight bearing and walking needs to be done carefully. You will need to wait until the numbness is entirely gone and the strength has returned. You should be able to move your leg and foot normally before you try and bear weight or walk. You will need someone to be with you when you first try to ensure you do not fall and possibly risk injury. ° °4. Bruising and tenderness at the needle site are common side effects and will resolve in a few days. ° °5. Persistent numbness or new problems with movement should be communicated to the surgeon or the [facility] ([phone])/ [facility] ([phone]). °

## 2015-05-28 ENCOUNTER — Encounter (HOSPITAL_BASED_OUTPATIENT_CLINIC_OR_DEPARTMENT_OTHER): Payer: Self-pay | Admitting: Specialist

## 2015-06-18 NOTE — H&P (Signed)
Terry Graham is an 60 y.o. male.   Chief Complaint: shoulder pain [id number] presents with joint discomfort that had been persistent for several months now. Despite conservative treatments, his discomfort has not improved. Imaging was obtained. Other conservative and surgical treatments were discussed in detail. Patient wishes to proceed with surgery as consented. Denies SOB, CP, or calf pain. No Fever, chills, or nausea/ vomiting.   Past Medical History  Diagnosis Date  . PONV (postoperative nausea and vomiting)   . Hypertension   . Exercise-induced asthma   . History of positive hepatitis C     positive via blood test and treated 2005 ,  cured  . History of hepatitis B     per pt postitive blood test yrs ago but last test negative  . BPH (benign prostatic hypertrophy)   . Nocturia   . OA (osteoarthritis)     knees, shoulders,  and right shouler AC joint  . Right rotator cuff tear   . Labral tear of shoulder     right  . Gout     per pt currently stable 05-26-2015  . Eczema   . Wears contact lenses     Past Surgical History  Procedure Laterality Date  . Right knee surgery's  x12   starting at age 61     includes  ACL repair/ MCL repair/ multiple menisectomy's  . 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 Knee arthroscopy Left 1980's  . Shoulder arthroscopy with rotator cuff repair Left x2   last one 2012  . Shoulder arthroscopy with rotator cuff repair and subacromial decompression Right 05/27/2015    Procedure: RIGHT SHOULDER ARTHROSCOPY WITH DEBRIDEMENT, ROTATOR CUFF REPAIR AND SUBACROMIAL DECOMPRESSION, DISTAL CLAVICLE RESECTION;  Surgeon: Eugenia Mcalpine, MD;  Location: [facility] [place];  Service: Orthopedics;  Laterality: Right;    History reviewed. No pertinent family history. Social History:  reports that he has never smoked. His smokeless tobacco use includes Snuff. He reports that he drinks alcohol. He reports that he does not use illicit  drugs.  Allergies:  Allergies  Allergen Reactions  . Biaxin [Clarithromycin] Rash  . Ciprofloxacin Rash  . Sulfa Antibiotics Rash  . Tetracyclines & Related Rash    No prescriptions prior to admission    No results found for this or any previous visit (from the past 48 hour(s)). No results found.  Review of Systems  Constitutional: Negative.   HENT: Negative.   Eyes: Negative.   Respiratory: Negative.   Cardiovascular: Negative.   Gastrointestinal: Negative.   Genitourinary: Negative.   Musculoskeletal: Positive for joint pain.  Skin: Negative.   Neurological: Negative.   Endo/Heme/Allergies: Negative.   Psychiatric/Behavioral: Negative.     Blood pressure 122/69, pulse 50, temperature 97.7 F (36.5 C), temperature source Oral, resp. rate 14, height  (1.702 m), weight 80.287 kg (177 lb), SpO2 99 %. Physical Exam  Constitutional: He is oriented to person, place, and time. He appears well-developed.  HENT:  Head: Normocephalic.  Eyes: EOM are normal.  Neck: Normal range of motion.  Cardiovascular: Normal rate and intact distal pulses.   Respiratory: Effort normal.  GI: Soft.  Genitourinary:  Deferred  Musculoskeletal:  Shoulder pain with rom. Upper arm is grossly n/v intact.  Neurological: He is alert and oriented to person, place, and time.  Skin: Skin is dry.  Psychiatric: His behavior is normal.     Assessment/Plan Shoulder pain:  Shoulder scope as consented today at [facility]  D/c home with family Follow up in office Follow instructions  Markham JordanSTILWELL, Jaylea Plourde L, PA-C 06/18/2015, 1:43 PM

## 2019-01-30 ENCOUNTER — Ambulatory Visit: Payer: Commercial Managed Care - PPO

## 2019-02-08 ENCOUNTER — Ambulatory Visit: Payer: PRIVATE HEALTH INSURANCE | Attending: Internal Medicine

## 2019-02-08 DIAGNOSIS — Z23 Encounter for immunization: Secondary | ICD-10-CM | POA: Insufficient documentation

## 2019-02-08 NOTE — Progress Notes (Signed)
   Covid-19 Vaccination Clinic  Name:  ZYSHONNE MALECHA    MRN: [id number] DOB: 05-23-1955  02/08/2019  Mr. Cockrell was observed post Covid-19 immunization for 15 minutes without incidence. He was provided with Vaccine Information Sheet and instruction to access the V-Safe system.   Mr. Kintz was instructed to call 911 with any severe reactions post vaccine: Marland Kitchen Difficulty breathing  . Swelling of your face and throat  . A fast heartbeat  . A bad rash all over your body  . Dizziness and weakness    Immunizations Administered    Name Date Dose VIS Date Route   Pfizer COVID-19 Vaccine 02/08/2019 12:21 PM 0.3 mL 12/13/2018 Intramuscular   Manufacturer: [employer], [employer]   Lot: [id number]   NDC: [address]-6

## 2019-02-20 ENCOUNTER — Ambulatory Visit: Payer: Commercial Managed Care - PPO

## 2019-03-05 ENCOUNTER — Ambulatory Visit: Payer: PRIVATE HEALTH INSURANCE | Attending: Internal Medicine

## 2019-03-05 DIAGNOSIS — Z23 Encounter for immunization: Secondary | ICD-10-CM | POA: Insufficient documentation

## 2019-03-05 NOTE — Progress Notes (Signed)
   Covid-19 Vaccination Clinic  Name:  Terry Graham    MRN: [id number] DOB: Jun 21, 1955  03/05/2019  Mr. Terry Graham was observed post Covid-19 immunization for 15 minutes without incident. He was provided with Vaccine Information Sheet and instruction to access the V-Safe system.   Mr. Terry Graham was instructed to call 911 with any severe reactions post vaccine: Marland Kitchen Difficulty breathing  . Swelling of face and throat  . A fast heartbeat  . A bad rash all over body  . Dizziness and weakness   Immunizations Administered    Name Date Dose VIS Date Route   Pfizer COVID-19 Vaccine 03/05/2019  8:43 AM 0.3 mL 12/13/2018 Intramuscular   Manufacturer: [employer], [employer]   Lot: [id number]   NDC: [address]-5

## 2020-06-03 ENCOUNTER — Other Ambulatory Visit: Payer: Self-pay | Admitting: Urology

## 2020-06-28 NOTE — Progress Notes (Signed)
DUE TO COVID-19 ONLY ONE VISITOR IS ALLOWED TO COME WITH YOU AND STAY IN THE WAITING ROOM ONLY DURING PRE OP AND PROCEDURE DAY OF SURGERY. THE 1 VISITOR  MAY VISIT WITH YOU AFTER SURGERY IN YOUR PRIVATE ROOM DURING VISITING HOURS ONLY!  YOU NEED TO HAVE A COVID 19 TEST ON___7/07/2020 ____ @_______ , THIS TEST MUST BE DONE BEFORE SURGERY,  COVID TESTING SITE [address] [place] , IT IS ON THE RIGHT GOING OUT [address] APPROXIMATELY  2 MINUTES PAST [employer] ON THE RIGHT. ONCE YOUR COVID TEST IS COMPLETED,  PLEASE BEGIN THE QUARANTINE INSTRUCTIONS AS OUTLINED IN YOUR HANDOUT.                Terry Graham  06/28/2020   Your procedure is scheduled on:  07/12/2020   Report to [facility] Main  Entrance   Report to admitting at     1000AM     Call this number if you have problems the morning of surgery [phone]    Remember: Do not eat food , candy gum or mints :After Midnight. You may have clear liquids from midnight until  0900am     CLEAR LIQUID DIET   Foods Allowed                                                                       Coffee and tea, regular and decaf                              Plain Jell-O any favor except red or purple                                            Fruit ices (not with fruit pulp)                                      Iced Popsicles                                     Carbonated beverages, regular and diet                                    Cranberry, grape and apple juices Sports drinks like Gatorade Lightly seasoned clear broth or consume(fat free) Sugar, honey syrup   _____________________________________________________________________    BRUSH YOUR TEETH MORNING OF SURGERY AND RINSE YOUR MOUTH OUT, NO CHEWING GUM CANDY OR MINTS.     Take these medicines the morning of surgery with A SIP OF WATER:     Flomax, inhalers as usual and bring  DO NOT TAKE ANY DIABETIC MEDICATIONS DAY OF YOUR  SURGERY  You may not have any metal on your body including hair pins and              piercings  Do not wear jewelry, make-up, lotions, powders or perfumes, deodorant             Do not wear nail polish on your fingernails.  Do not shave  48 hours prior to surgery.              Men may shave face and neck.   Do not bring valuables to the hospital. [place].  Contacts, dentures or bridgework may not be worn into surgery.  Leave suitcase in the car. After surgery it may be brought to your room.     Patients discharged the day of surgery will not be allowed to drive home. IF YOU ARE HAVING SURGERY AND GOING HOME THE SAME DAY, YOU MUST HAVE AN ADULT TO DRIVE YOU HOME AND BE WITH YOU FOR 24 HOURS. YOU MAY GO HOME BY TAXI OR UBER OR ORTHERWISE, BUT AN ADULT MUST ACCOMPANY YOU HOME AND STAY WITH YOU FOR 24 HOURS.  Name and phone number of your driver:  Special Instructions: N/A              Please read over the following fact sheets you were given: _____________________________________________________________________  [facility] - Preparing for Surgery Before surgery, you can play an important role.  Because skin is not sterile, your skin needs to be as free of germs as possible.  You can reduce the number of germs on your skin by washing with CHG (chlorahexidine gluconate) soap before surgery.  CHG is an antiseptic cleaner which kills germs and bonds with the skin to continue killing germs even after washing. Please DO NOT use if you have an allergy to CHG or antibacterial soaps.  If your skin becomes reddened/irritated stop using the CHG and inform your nurse when you arrive at Short Stay. Do not shave (including legs and underarms) for at least 48 hours prior to the first CHG shower.  You may shave your face/neck. Please follow these instructions carefully:  1.  Shower with CHG Soap the night before surgery and the   morning of Surgery.  2.  If you choose to wash your hair, wash your hair first as usual with your  normal  shampoo.  3.  After you shampoo, rinse your hair and body thoroughly to remove the  shampoo.                           4.  Use CHG as you would any other liquid soap.  You can apply chg directly  to the skin and wash                       Gently with a scrungie or clean washcloth.  5.  Apply the CHG Soap to your body ONLY FROM THE NECK DOWN.   Do not use on face/ open                           Wound or open sores. Avoid contact with eyes, ears mouth and genitals (private parts).  Wash face,  Genitals (private parts) with your normal soap.             6.  Wash thoroughly, paying special attention to the area where your surgery  will be performed.  7.  Thoroughly rinse your body with warm water from the neck down.  8.  DO NOT shower/wash with your normal soap after using and rinsing off  the CHG Soap.                9.  Pat yourself dry with a clean towel.            10.  Wear clean pajamas.            11.  Place clean sheets on your bed the night of your first shower and do not  sleep with pets. Day of Surgery : Do not apply any lotions/deodorants the morning of surgery.  Please wear clean clothes to the hospital/surgery center.  FAILURE TO FOLLOW THESE INSTRUCTIONS MAY RESULT IN THE CANCELLATION OF YOUR SURGERY PATIENT SIGNATURE_________________________________  NURSE SIGNATURE__________________________________  ________________________________________________________________________

## 2020-07-01 ENCOUNTER — Other Ambulatory Visit: Payer: Self-pay

## 2020-07-01 ENCOUNTER — Encounter (HOSPITAL_COMMUNITY): Payer: Self-pay

## 2020-07-01 ENCOUNTER — Encounter (HOSPITAL_COMMUNITY)
Admission: RE | Admit: 2020-07-01 | Discharge: 2020-07-01 | Disposition: A | Discharge status: Home / Self Care | Payer: Medicare Other | Source: Ambulatory Visit | Attending: Urology | Admitting: Urology

## 2020-07-01 DIAGNOSIS — Z01812 Encounter for preprocedural laboratory examination: Secondary | ICD-10-CM | POA: Insufficient documentation

## 2020-07-01 DIAGNOSIS — Z79899 Other long term (current) drug therapy: Secondary | ICD-10-CM | POA: Insufficient documentation

## 2020-07-01 DIAGNOSIS — Z7982 Long term (current) use of aspirin: Secondary | ICD-10-CM | POA: Insufficient documentation

## 2020-07-01 DIAGNOSIS — N400 Enlarged prostate without lower urinary tract symptoms: Secondary | ICD-10-CM | POA: Insufficient documentation

## 2020-07-01 DIAGNOSIS — Z8619 Personal history of other infectious and parasitic diseases: Secondary | ICD-10-CM | POA: Insufficient documentation

## 2020-07-01 DIAGNOSIS — K219 Gastro-esophageal reflux disease without esophagitis: Secondary | ICD-10-CM | POA: Insufficient documentation

## 2020-07-01 HISTORY — DX: Pneumonia, unspecified organism: J18.9

## 2020-07-01 HISTORY — DX: Gastro-esophageal reflux disease without esophagitis: K21.9

## 2020-07-01 LAB — COMPREHENSIVE METABOLIC PANEL
ALT: 24 U/L (ref 0–44)
AST: 26 U/L (ref 15–41)
Albumin: 4.5 g/dL (ref 3.5–5.0)
Alkaline Phosphatase: 46 U/L (ref 38–126)
Anion gap: 6 (ref 5–15)
BUN: 21 mg/dL (ref 8–23)
CO2: 28 mmol/L (ref 22–32)
Calcium: 9.7 mg/dL (ref 8.9–10.3)
Chloride: 105 mmol/L (ref 98–111)
Creatinine, Ser: 0.92 mg/dL (ref 0.61–1.24)
GFR, Estimated: >60 mL/min (ref >60)
Glucose, Bld: 86 mg/dL (ref 70–99)
Potassium: 4.5 mmol/L (ref 3.5–5.1)
Sodium: 139 mmol/L (ref 135–145)
Total Bilirubin: 1.2 mg/dL (ref 0.3–1.2)
Total Protein: 7.1 g/dL (ref 6.5–8.1)

## 2020-07-01 LAB — CBC
HCT: 43.7 % (ref 39.0–52.0)
Hemoglobin: 14.7 g/dL (ref 13.0–17.0)
MCH: 30.8 pg (ref 26.0–34.0)
MCHC: 33.6 g/dL (ref 30.0–36.0)
MCV: 91.6 fL (ref 80.0–100.0)
Platelets: 194 10*3/uL (ref 150–400)
RBC: 4.77 MIL/uL (ref 4.22–5.81)
RDW: 13.6 % (ref 11.5–15.5)
WBC: 4.9 10*3/uL (ref 4.0–10.5)
nRBC: 0 % (ref 0.0–0.2)

## 2020-07-01 NOTE — Progress Notes (Signed)
Anesthesia Chart Review   Case: [id number] Date/Time: 07/12/20 1145   Procedure: Holmium Laser Enucleation of the Prostate with Morcellation - ONLY NEEDS 150 MIN   Anesthesia type: General   Pre-op diagnosis: BENIGN PROSTATIC HYPERPLASIA   Location: [facility] PROCEDURE [facility] / [facility]   Surgeons: Jannifer Hick, MD       DISCUSSION:65 y.o. never smoker with h/o PONV, GERD, BPH, Hepatitis C treated 2005, BPH scheduled for above procedure 07/12/2020 with Dr. Jettie Pagan.   Pt seen by cardiology 03/08/2020 for evaluation of exercise induced palpitations.  Stress test ordered to to t wave inversions on EKG. CCTA Heart with less than 24% stenosis of LAD, otherwise no significant abnormality in coronary arteries.  Pt has good exercise capacity, rides bike regularly.   Anticipate pt can proceed with planned procedure barring acute status change.   VS: BP (!) 143/95   Pulse (!) 48   Temp 36.9 C (Oral)   Resp 16   Ht 5\' 7"  (1.702 m)   Wt 82.1 kg   SpO2 96%   BMI 28.35 kg/m   PROVIDERS: , MD is PCP   Worthy Rancher, MD is Cardiologist  LABS: Labs reviewed: Acceptable for surgery. (all labs ordered are listed, but only abnormal results are displayed)  Labs Reviewed  COMPREHENSIVE METABOLIC PANEL  CBC     IMAGES: CCTA Heart 05/03/2020 1.  Multifocal calcification involving proximal LAD causing less than 24% stenosis. Otherwise no significant abnormality in the coronary arteries. CAD RADS 1.  2.  Patients total coronary artery calcium score is 182, which is 61st percentile for patients of matched age, gender and race/ethnicity.    3.  Right coronary artery dominance.   EKG: 05/27/2015 Rate 42 bpm  Marked sinus bradycardia with 1st degree A-V block Minimal voltage criteria for LVH, may be normal variant Abnormal ECG  CV: Echo 07/01/2018 Findings  Mitral Valve  Structurally normal mitral valve.  No evidence of mitral stenosis.  Trace to mild mitral  regurgitation.  Aortic Valve  Trileaflet aortic valve with good leaflet mobility.  There is mild aortic valve sclerosis noted, with no evidence of stenosis.  Trace aortic regurgitation.  Tricuspid Valve  Tricuspid valve is structurally normal.  No evidence of tricuspid stenosis.  Trace tricuspid regurgitation.  RV systolic pressure: 32.5 mmHg.  Pulmonic Valve  Pulmonic valve is structurally normal.  No evidence of pulmonic stenosis.  Mild pulmonic regurgitation.  Left Atrium  Mildly dilated left atrium. Left atrial area of 30 cm squared.  Left Ventricle  Normal left ventricular size and systolic function with no appreciable  segmental abnormality.  Ejection fraction is visually estimated at 60-65%.  Diastolic function is normal for age.  Normal left ventricular wall thickness.  Right Atrium  Normal size right atrium.  Right Ventricle  Normal right ventricular size and function.  Pericardial Effusion  No evidence of pericardial effusion.  Pleural Effusion  No pleural effusion seen.  Miscellaneous  The aortic root diameter is within normal limits.  The IVC is dilated, but collapses > 50% with inspiration.   Stress Test 03/16/2020 SUMMARY  The patient had no chest pain.  The patient achieved 112 % of maximum predicted heart rate.  The METS achieved was 13.  Exercise capacity was good.  The maximal heart rate achieved was 173 bpm.  Target heart rate (greater than or equal to 85% predicted max)  achieved.  The baseline electrocardiogram was abnormal. It displayed nonspecific  ST-T Wave changes.  Arrhythmia induced during stress: frequent PVC's.  There was a maximum 1 mm ST segment depression in the inferolateral  lead(s).  Nondiagnostic stress ECG due to baseline ECG abnormaility.  Normal left ventricular function at rest.  Inferior and septal hypokinesis post exercisse  Positive exercise echocardiography for inducible ischemia at target  heart rate.  Past Medical  History:  Diagnosis Date   BPH (benign prostatic hypertrophy)    Eczema    Exercise-induced asthma    GERD (gastroesophageal reflux disease)    Gout    per pt currently stable 05-26-2015   History of hepatitis B    per pt postitive blood test yrs ago but last test negative   History of positive hepatitis C    positive via blood test and treated 2005 ,  cured   Labral tear of shoulder    right   Nocturia    OA (osteoarthritis)    knees, shoulders,  and right shouler AC joint   Pneumonia    PONV (postoperative nausea and vomiting)    Right rotator cuff tear    Wears contact lenses     Past Surgical History:  Procedure Laterality Date   CYST REMOVAL LEG     cyst removed left ankle and wrist   CYSTOSCOPY WITH INSERTION OF UROLIFT     KNEE ARTHROSCOPY Left 09/03/1978   right knee arthroscopy      cleaned out several times   RIGHT KNEE SURGERY'S  x12   starting at age 37    includes  ACL repair/ MCL repair/ multiple menisectomy's   SHOULDER ARTHROSCOPY WITH ROTATOR CUFF REPAIR Left x2   last one 2012   SHOULDER ARTHROSCOPY WITH ROTATOR CUFF REPAIR AND SUBACROMIAL DECOMPRESSION Right 05/27/2015   Procedure: RIGHT SHOULDER ARTHROSCOPY WITH DEBRIDEMENT, ROTATOR CUFF REPAIR AND SUBACROMIAL DECOMPRESSION, DISTAL CLAVICLE RESECTION;  Surgeon: Eugenia Mcalpine, MD;  Location: [facility];  Service: Orthopedics;  Laterality: Right;   TOTAL KNEE ARTHROPLASTY Bilateral right  2005/  left 2010    MEDICATIONS:  albuterol (VENTOLIN HFA) 108 (90 Base) MCG/ACT inhaler   aspirin EC 81 MG tablet   Cholecalciferol (VITAMIN D) 50 MCG (2000 UT) tablet   Coenzyme Q10 (COQ10) 200 MG CAPS   fluocinonide cream (LIDEX) 0.05 %   indomethacin (INDOCIN) 50 MG capsule   rosuvastatin (CRESTOR) 20 MG tablet   tadalafil (CIALIS) 5 MG tablet   tamsulosin (FLOMAX) 0.4 MG CAPS capsule   vitamin B-12 (CYANOCOBALAMIN) 1000 MCG tablet   zolpidem (AMBIEN) 10 MG tablet   No current  facility-administered medications for this encounter.   Jodell Cipro, PA-C [facility]-Surgical Testing [phone]

## 2020-07-01 NOTE — Progress Notes (Addendum)
Anesthesia Review:  PCP: DR Rikki Spearing  Cardiologist : DR Margot Ables initial consult 03/08/20 LOV 03/16/20  Chest x-ray : EKG : 03/08/20- on chart  Stress Echo- 03/16/20  CCTA 05/03/20  Echo : Stress test: Cardiac Cath :  Activity level:  exercises daily per pt  Sleep Study/ CPAP : Fasting Blood Sugar :      / Checks Blood Sugar -- times a day:   Blood Thinner/ Instructions /Last Dose: ASA / Instructions/ Last Dose :   81 mg aspiring  Off ofo blood pressure meds x 2 years per pt due to blood pressure too low.   Hx of tachycardia - see above workup and test results.   Hx of pneumonia as a child  Hx of exercise induced asthma

## 2020-07-08 ENCOUNTER — Other Ambulatory Visit (HOSPITAL_COMMUNITY)
Admission: RE | Admit: 2020-07-08 | Discharge: 2020-07-08 | Disposition: A | Discharge status: Home / Self Care | Payer: Medicare Other | Source: Ambulatory Visit | Attending: Urology | Admitting: Urology

## 2020-07-08 DIAGNOSIS — Z01812 Encounter for preprocedural laboratory examination: Secondary | ICD-10-CM | POA: Diagnosis present

## 2020-07-08 DIAGNOSIS — Z20822 Contact with and (suspected) exposure to covid-19: Secondary | ICD-10-CM | POA: Diagnosis not present

## 2020-07-08 LAB — SARS CORONAVIRUS 2 (TAT 6-24 HRS): SARS Coronavirus 2: NEGATIVE

## 2020-07-12 ENCOUNTER — Observation Stay (HOSPITAL_COMMUNITY)
Admission: RE | Admit: 2020-07-12 | Discharge: 2020-07-13 | Disposition: A | Discharge status: Home / Self Care | Payer: Medicare Other | Attending: Urology | Admitting: Urology

## 2020-07-12 ENCOUNTER — Encounter (HOSPITAL_COMMUNITY): Payer: Self-pay | Admitting: Urology

## 2020-07-12 ENCOUNTER — Ambulatory Visit (HOSPITAL_COMMUNITY): Payer: Medicare Other | Admitting: Physician Assistant

## 2020-07-12 ENCOUNTER — Other Ambulatory Visit: Payer: Self-pay

## 2020-07-12 ENCOUNTER — Ambulatory Visit (HOSPITAL_COMMUNITY): Payer: Medicare Other | Admitting: Anesthesiology

## 2020-07-12 ENCOUNTER — Encounter (HOSPITAL_COMMUNITY)
Admission: RE | Disposition: A | Discharge status: Home / Self Care | Payer: Self-pay | Source: Home / Self Care | Attending: Urology

## 2020-07-12 DIAGNOSIS — R350 Frequency of micturition: Secondary | ICD-10-CM | POA: Insufficient documentation

## 2020-07-12 DIAGNOSIS — N400 Enlarged prostate without lower urinary tract symptoms: Secondary | ICD-10-CM | POA: Diagnosis present

## 2020-07-12 DIAGNOSIS — N401 Enlarged prostate with lower urinary tract symptoms: Secondary | ICD-10-CM | POA: Diagnosis present

## 2020-07-12 DIAGNOSIS — Z96653 Presence of artificial knee joint, bilateral: Secondary | ICD-10-CM | POA: Insufficient documentation

## 2020-07-12 LAB — HEMOGLOBIN AND HEMATOCRIT, BLOOD
HCT: 41.2 % (ref 39.0–52.0)
Hemoglobin: 13.9 g/dL (ref 13.0–17.0)

## 2020-07-12 SURGERY — Holmium Laser Enucleation of the Prostate with Morcellation
Anesthesia: General | Site: Prostate

## 2020-07-12 MED ORDER — STERILE WATER FOR IRRIGATION IR SOLN
Status: DC | PRN
  Administered 2020-07-12: 500 mL

## 2020-07-12 MED ORDER — ONDANSETRON HCL 4 MG/2ML IJ SOLN
INTRAMUSCULAR | Status: DC | PRN
  Administered 2020-07-12: 4 mg via INTRAVENOUS

## 2020-07-12 MED ORDER — ROCURONIUM BROMIDE 100 MG/10ML IV SOLN
INTRAVENOUS | Status: DC | PRN
  Administered 2020-07-12: 10 mg via INTRAVENOUS
  Administered 2020-07-12: 60 mg via INTRAVENOUS

## 2020-07-12 MED ORDER — MEPERIDINE HCL 50 MG/ML IJ SOLN: 6.2500 mg | INTRAMUSCULAR | Status: DC | PRN

## 2020-07-12 MED ORDER — ORAL CARE MOUTH RINSE: 15.0000 mL | Freq: Once | OROMUCOSAL | Status: AC

## 2020-07-12 MED ORDER — 0.9 % SODIUM CHLORIDE (POUR BTL) OPTIME
TOPICAL | Status: DC | PRN
  Administered 2020-07-12: 1000 mL

## 2020-07-12 MED ORDER — MIDAZOLAM HCL 2 MG/2ML IJ SOLN: 0.5000 mg | Freq: Once | INTRAMUSCULAR | Status: DC | PRN

## 2020-07-12 MED ORDER — OXYCODONE-ACETAMINOPHEN 5-325 MG PO TABS: 1.0000 | ORAL_TABLET | ORAL | 0 refills | Status: AC | PRN

## 2020-07-12 MED ORDER — MIDAZOLAM HCL 2 MG/2ML IJ SOLN
INTRAMUSCULAR | Status: AC
  Filled 2020-07-12: qty 2

## 2020-07-12 MED ORDER — CHLORHEXIDINE GLUCONATE 0.12 % MT SOLN
15.0000 mL | Freq: Once | OROMUCOSAL | Status: AC
  Administered 2020-07-12: 15 mL via OROMUCOSAL

## 2020-07-12 MED ORDER — ZOLPIDEM TARTRATE 10 MG PO TABS: 5.0000 mg | ORAL_TABLET | Freq: Every evening | ORAL | Status: DC | PRN

## 2020-07-12 MED ORDER — CHLORHEXIDINE GLUCONATE CLOTH 2 % EX PADS
6.0000 | MEDICATED_PAD | Freq: Every day | CUTANEOUS | Status: DC
  Administered 2020-07-13: 6 via TOPICAL

## 2020-07-12 MED ORDER — TAMSULOSIN HCL 0.4 MG PO CAPS
0.4000 mg | ORAL_CAPSULE | Freq: Every day | ORAL | Status: DC
  Administered 2020-07-13: 0.4 mg via ORAL
  Filled 2020-07-12: qty 1

## 2020-07-12 MED ORDER — DOCUSATE SODIUM 100 MG PO CAPS
100.0000 mg | ORAL_CAPSULE | Freq: Two times a day (BID) | ORAL | Status: DC
  Administered 2020-07-12 – 2020-07-13 (×2): 100 mg via ORAL
  Filled 2020-07-12 (×2): qty 1

## 2020-07-12 MED ORDER — BACITRACIN-NEOMYCIN-POLYMYXIN 400-5-5000 EX OINT: 1.0000 "application " | TOPICAL_OINTMENT | Freq: Three times a day (TID) | CUTANEOUS | Status: DC | PRN

## 2020-07-12 MED ORDER — EPHEDRINE 5 MG/ML INJ
INTRAVENOUS | Status: AC
  Filled 2020-07-12: qty 10

## 2020-07-12 MED ORDER — LACTATED RINGERS IV SOLN: INTRAVENOUS | Status: DC

## 2020-07-12 MED ORDER — FENTANYL CITRATE (PF) 100 MCG/2ML IJ SOLN
INTRAMUSCULAR | Status: DC | PRN
  Administered 2020-07-12 (×2): 50 ug via INTRAVENOUS

## 2020-07-12 MED ORDER — LIDOCAINE 2% (20 MG/ML) 5 ML SYRINGE
INTRAMUSCULAR | Status: AC
  Filled 2020-07-12: qty 5

## 2020-07-12 MED ORDER — ALBUTEROL SULFATE (2.5 MG/3ML) 0.083% IN NEBU: 2.5000 mg | INHALATION_SOLUTION | Freq: Four times a day (QID) | RESPIRATORY_TRACT | Status: DC | PRN

## 2020-07-12 MED ORDER — MIDAZOLAM HCL 5 MG/5ML IJ SOLN
INTRAMUSCULAR | Status: DC | PRN
  Administered 2020-07-12: 2 mg via INTRAVENOUS

## 2020-07-12 MED ORDER — ROCURONIUM BROMIDE 10 MG/ML (PF) SYRINGE
PREFILLED_SYRINGE | INTRAVENOUS | Status: AC
  Filled 2020-07-12: qty 10

## 2020-07-12 MED ORDER — SODIUM CHLORIDE 0.9 % IR SOLN
Status: DC | PRN
  Administered 2020-07-12 (×2): 6000 mL via INTRAVESICAL
  Administered 2020-07-12: 3000 mL via INTRAVESICAL
  Administered 2020-07-12 (×3): 6000 mL via INTRAVESICAL

## 2020-07-12 MED ORDER — BELLADONNA ALKALOIDS-OPIUM 16.2-30 MG RE SUPP
1.0000 | Freq: Three times a day (TID) | RECTAL | Status: DC | PRN
Start: 2020-07-12 — End: 2020-07-13

## 2020-07-12 MED ORDER — SODIUM CHLORIDE 0.9 % IR SOLN
3000.0000 mL | Status: DC
  Administered 2020-07-13 (×3): 3000 mL

## 2020-07-12 MED ORDER — DEXAMETHASONE SODIUM PHOSPHATE 10 MG/ML IJ SOLN
INTRAMUSCULAR | Status: AC
  Filled 2020-07-12: qty 1

## 2020-07-12 MED ORDER — OXYCODONE HCL 5 MG PO TABS
5.0000 mg | ORAL_TABLET | ORAL | Status: DC | PRN
  Administered 2020-07-12 – 2020-07-13 (×4): 5 mg via ORAL
  Filled 2020-07-12 (×4): qty 1

## 2020-07-12 MED ORDER — EPHEDRINE SULFATE 50 MG/ML IJ SOLN
INTRAMUSCULAR | Status: DC | PRN
  Administered 2020-07-12 (×2): 10 mg via INTRAVENOUS

## 2020-07-12 MED ORDER — OXYCODONE HCL 5 MG PO TABS
ORAL_TABLET | ORAL | Status: AC
  Filled 2020-07-12: qty 1

## 2020-07-12 MED ORDER — CEFAZOLIN SODIUM-DEXTROSE 2-4 GM/100ML-% IV SOLN
2.0000 g | Freq: Once | INTRAVENOUS | Status: AC
  Administered 2020-07-12: 2 g via INTRAVENOUS
  Filled 2020-07-12: qty 100

## 2020-07-12 MED ORDER — DOCUSATE SODIUM 100 MG PO CAPS: 100.0000 mg | ORAL_CAPSULE | Freq: Every day | ORAL | 0 refills | Status: AC | PRN

## 2020-07-12 MED ORDER — PROPOFOL 10 MG/ML IV BOLUS
INTRAVENOUS | Status: AC
  Filled 2020-07-12: qty 20

## 2020-07-12 MED ORDER — PROMETHAZINE HCL 25 MG/ML IJ SOLN: 6.2500 mg | INTRAMUSCULAR | Status: DC | PRN

## 2020-07-12 MED ORDER — ONDANSETRON HCL 4 MG/2ML IJ SOLN
INTRAMUSCULAR | Status: AC
  Filled 2020-07-12: qty 2

## 2020-07-12 MED ORDER — MORPHINE SULFATE (PF) 2 MG/ML IV SOLN: 2.0000 mg | INTRAVENOUS | Status: AC | PRN

## 2020-07-12 MED ORDER — PROPOFOL 10 MG/ML IV BOLUS
INTRAVENOUS | Status: DC | PRN
  Administered 2020-07-12: 200 mg via INTRAVENOUS

## 2020-07-12 MED ORDER — FENTANYL CITRATE (PF) 100 MCG/2ML IJ SOLN
INTRAMUSCULAR | Status: AC
  Filled 2020-07-12: qty 2

## 2020-07-12 MED ORDER — HYDROMORPHONE HCL 1 MG/ML IJ SOLN: 0.2500 mg | INTRAMUSCULAR | Status: DC | PRN

## 2020-07-12 MED ORDER — SUGAMMADEX SODIUM 200 MG/2ML IV SOLN
INTRAVENOUS | Status: DC | PRN
  Administered 2020-07-12: 170 mg via INTRAVENOUS

## 2020-07-12 MED ORDER — SCOPOLAMINE 1 MG/3DAYS TD PT72
MEDICATED_PATCH | TRANSDERMAL | Status: DC | PRN
  Administered 2020-07-12: 1 via TRANSDERMAL

## 2020-07-12 MED ORDER — LIDOCAINE HCL (CARDIAC) PF 100 MG/5ML IV SOSY
PREFILLED_SYRINGE | INTRAVENOUS | Status: DC | PRN
  Administered 2020-07-12: 100 mg via INTRAVENOUS

## 2020-07-12 MED ORDER — DEXTROSE-NACL 5-0.45 % IV SOLN: INTRAVENOUS | Status: DC

## 2020-07-12 MED ORDER — ROSUVASTATIN CALCIUM 20 MG PO TABS
20.0000 mg | ORAL_TABLET | Freq: Every day | ORAL | Status: DC
  Administered 2020-07-13: 20 mg via ORAL
  Filled 2020-07-12: qty 1

## 2020-07-12 MED ORDER — ACETAMINOPHEN 500 MG PO TABS
1000.0000 mg | ORAL_TABLET | Freq: Four times a day (QID) | ORAL | Status: DC
  Administered 2020-07-12 – 2020-07-13 (×3): 1000 mg via ORAL
  Filled 2020-07-12 (×3): qty 2

## 2020-07-12 MED ORDER — ONDANSETRON HCL 4 MG/2ML IJ SOLN: 4.0000 mg | INTRAMUSCULAR | Status: DC | PRN

## 2020-07-12 MED ORDER — OXYBUTYNIN CHLORIDE 5 MG PO TABS: 5.0000 mg | ORAL_TABLET | Freq: Three times a day (TID) | ORAL | Status: DC | PRN

## 2020-07-12 MED ORDER — OXYCODONE HCL 5 MG PO TABS
5.0000 mg | ORAL_TABLET | Freq: Once | ORAL | Status: AC | PRN
  Administered 2020-07-12: 5 mg via ORAL

## 2020-07-12 MED ORDER — OXYCODONE HCL 5 MG/5ML PO SOLN: 5.0000 mg | Freq: Once | ORAL | Status: AC | PRN

## 2020-07-12 MED ORDER — CEFAZOLIN SODIUM-DEXTROSE 1-4 GM/50ML-% IV SOLN
1.0000 g | Freq: Three times a day (TID) | INTRAVENOUS | Status: AC
  Administered 2020-07-12 – 2020-07-13 (×2): 1 g via INTRAVENOUS
  Filled 2020-07-12 (×2): qty 50

## 2020-07-12 MED ORDER — SCOPOLAMINE 1 MG/3DAYS TD PT72
MEDICATED_PATCH | TRANSDERMAL | Status: AC
  Filled 2020-07-12: qty 1

## 2020-07-12 MED ORDER — DEXAMETHASONE SODIUM PHOSPHATE 10 MG/ML IJ SOLN
INTRAMUSCULAR | Status: DC | PRN
  Administered 2020-07-12: 10 mg via INTRAVENOUS

## 2020-07-12 MED ORDER — ALBUTEROL SULFATE HFA 108 (90 BASE) MCG/ACT IN AERS: 2.0000 | INHALATION_SPRAY | Freq: Four times a day (QID) | RESPIRATORY_TRACT | Status: DC | PRN

## 2020-07-12 SURGICAL SUPPLY — 42 items
ADAPTER IRRIG TUBE 2 SPIKE SOL (ADAPTER) ×4 IMPLANT
ADPR TBG 2 SPK PMP STRL ASCP (ADAPTER) ×2
BAG DRN LRG CPC RND TRDRP CNTR (MISCELLANEOUS) ×1
BAG URO CATCHER STRL LF (MISCELLANEOUS) ×2 IMPLANT
BAG URO DRAIN 4000ML (MISCELLANEOUS) ×2 IMPLANT
BAND INSRT 18 STRL LF DISP RB (MISCELLANEOUS)
BAND RUBBER #18 3X1/16 STRL (MISCELLANEOUS) ×1 IMPLANT
CATH FOLEY 3WAY 30CC 22FR (CATHETERS) ×1 IMPLANT
CATH FOLEY 3WAY 30CC 24FR (CATHETERS)
CATH URET 5FR 28IN OPEN ENDED (CATHETERS) ×1 IMPLANT
CATH URETL 5X70 OPEN END (CATHETERS) IMPLANT
CATH URTH STD 24FR FL 3W 2 (CATHETERS) ×1 IMPLANT
CONTAINER COLLECT MORCELLATR (MISCELLANEOUS) ×1 IMPLANT
DRAPE UTILITY 15X26 TOWEL STRL (DRAPES) IMPLANT
ELECT BIVAP BIPO 22/24 DONUT (ELECTROSURGICAL)
ELECTRD BIVAP BIPO 22/24 DONUT (ELECTROSURGICAL) IMPLANT
FIBER LASER MOSES 550 DFL (Laser) ×1 IMPLANT
FILTER OVERFLOW MORCELLATOR (FILTER) ×1 IMPLANT
GLOVE SURG ENC TEXT LTX SZ7 (GLOVE) ×2 IMPLANT
GOWN STRL REUS W/TWL LRG LVL3 (GOWN DISPOSABLE) ×3 IMPLANT
GUIDEWIRE STR DUAL SENSOR (WIRE) ×1 IMPLANT
HOLDER FOLEY CATH W/STRAP (MISCELLANEOUS) ×2 IMPLANT
IV NS IRRIG 3000ML ARTHROMATIC (IV SOLUTION) ×10 IMPLANT
KIT TURNOVER KIT A (KITS) ×2 IMPLANT
LOOP CUT BIPOLAR 24F LRG (ELECTROSURGICAL) ×3 IMPLANT
MBRN O SEALING YLW 17 FOR INST (MISCELLANEOUS)
MEMBRANE SLNG YLW 17 FOR INST (MISCELLANEOUS) ×1 IMPLANT
MORCELLATOR COLLECT CONTAINER (MISCELLANEOUS)
MORCELLATOR OVERFLOW FILTER (FILTER)
MORCELLATOR ROTATION 4.75 335 (MISCELLANEOUS) ×1 IMPLANT
PACK CYSTO (CUSTOM PROCEDURE TRAY) ×2 IMPLANT
PIN SAFETY STERILE (MISCELLANEOUS) ×1 IMPLANT
SET IRRIG Y TYPE TUR BLADDER L (SET/KITS/TRAYS/PACK) ×1 IMPLANT
SLEEVE SURGEON STRL (DRAPES) ×4 IMPLANT
SYR 30ML LL (SYRINGE) ×2 IMPLANT
SYR TOOMEY IRRIG 70ML (MISCELLANEOUS) ×2
SYRINGE TOOMEY IRRIG 70ML (MISCELLANEOUS) ×1 IMPLANT
TUBE PUMP MORCELLATOR PIRANHA (TUBING) ×1 IMPLANT
TUBING CONNECTING 10 (TUBING) ×2 IMPLANT
TUBING UROLOGY SET (TUBING) ×2 IMPLANT
WATER STERILE IRR 1000ML POUR (IV SOLUTION) ×2 IMPLANT
WATER STERILE IRR 500ML POUR (IV SOLUTION) ×2 IMPLANT

## 2020-07-12 NOTE — Discharge Instructions (Signed)

## 2020-07-12 NOTE — Transfer of Care (Signed)
Immediate Anesthesia Transfer of Care Note  Patient: Terry Graham  Procedure(s) Performed: TRANSURETHRAL RESECTION OF THE PROSTATE (Prostate)  Patient Location: PACU  Anesthesia Type:General  Level of Consciousness: awake, alert  and oriented  Airway & Oxygen Therapy: Patient Spontanous Breathing and Patient connected to face mask oxygen  Post-op Assessment: Report given to RN, Post -op Vital signs reviewed and stable and Patient moving all extremities X 4  Post vital signs: Reviewed and stable  Last Vitals:  Vitals Value Taken Time  BP 143/81 07/12/20 1522  Temp    Pulse 55 07/12/20 1524  Resp 13 07/12/20 1524  SpO2 95 % 07/12/20 1524  Vitals shown include unvalidated device data.  Last Pain:  Vitals:   07/12/20 1047  TempSrc: Oral  PainSc:          Complications: No notable events documented.

## 2020-07-12 NOTE — Discharge Summary (Addendum)
[facility] Discharge Summary  Admit date: 07/12/2020  Discharge date and time: 07/13/20   Discharge to: Home  Discharge Service: Urology  Discharge Attending Physician:  Jettie Pagan, MD  Discharge  Diagnoses: BPH  Secondary Diagnosis: Active Problems:   BPH (benign prostatic hyperplasia)   OR Procedures: Procedure(s): TRANSURETHRAL RESECTION OF THE PROSTATE 07/12/2020   Ancillary Procedures: None   Discharge Day Services: The patient was seen and examined by the Urology team both in the morning and immediately prior to discharge.  Vital signs and laboratory values were stable and within normal limits.  The physical exam was benign and unchanged and all surgical wounds were examined.  Discharge instructions were explained and all questions answered.  Subjective  No acute events overnight. Pain Controlled. No fever or chills.  Objective Patient Vitals for the past 8 hrs:  BP Temp Pulse Resp SpO2  07/13/20 0546 115/72 98 F (36.7 C) (!) 45 16 94 %   Total I/O In: -  Out: 650 [Urine:650]  General Appearance:        No acute distress Lungs:                       Normal work of breathing on room air Heart:                                Regular rate and rhythm Abdomen:                         Soft, non-tender, non-distended GU:    Foley draining clear light pink urine with CBI clamped.  Extremities:                      Warm and well perfused   Hospital Course:  The patient underwent TURP on 07/12/2020.  The patient tolerated the procedure well, was extubated in the OR, and afterwards was taken to the PACU for routine post-surgical care. When stable the patient was transferred to the floor.   The patient did well postoperatively.  The patient's diet was slowly advanced and at the time of discharge was tolerating a regular diet.  The patient was discharged home 1 Day Post-Op, at which point was tolerating a regular solid diet, , have adequate pain control with P.O. pain  medication, and could ambulate without difficulty. The patient will follow up with [place] for post op check in 3 days and TOV.  His urine was clear with CBI clamped and his hemoglobin was stable. Will receive one dose cipro on day of catheter removal.   Condition at Discharge: Improved  Discharge Medications:  Allergies as of 07/13/2020       Reactions   Biaxin [clarithromycin] Rash   Ciprofloxacin Rash   Sulfa Antibiotics Rash   Tetracyclines & Related Rash        Medication List     TAKE these medications    albuterol 108 (90 Base) MCG/ACT inhaler Commonly known as: VENTOLIN HFA Inhale 2 puffs into the lungs every 6 (six) hours as needed for wheezing or shortness of breath.   aspirin EC 81 MG tablet Take 81 mg by mouth daily. Swallow whole.   CoQ10 200 MG Caps Take 200 mg by mouth daily.   docusate sodium 100 MG capsule Commonly known as: Colace Take 1 capsule (100 mg total) by mouth daily as needed for up  to 30 doses.   fluocinonide cream 0.05 % Commonly known as: LIDEX Apply 1 application topically 2 (two) times daily as needed (rash).   indomethacin 50 MG capsule Commonly known as: INDOCIN Take 50 mg by mouth 3 (three) times daily as needed (gout).   oxyCODONE-acetaminophen 5-325 MG tablet Commonly known as: Percocet Take 1 tablet by mouth every 4 (four) hours as needed for up to 18 doses for severe pain.   rosuvastatin 20 MG tablet Commonly known as: CRESTOR Take 20 mg by mouth daily.   tadalafil 5 MG tablet Commonly known as: CIALIS Take 5 mg by mouth every morning.   tamsulosin 0.4 MG Caps capsule Commonly known as: FLOMAX Take 0.4 mg by mouth daily.   vitamin B-12 1000 MCG tablet Commonly known as: CYANOCOBALAMIN Take 1,000 mcg by mouth daily.   Vitamin D 50 MCG (2000 UT) tablet Take 2,000 Units by mouth daily.   zolpidem 10 MG tablet Commonly known as: AMBIEN Take 10 mg by mouth at bedtime as needed for sleep.

## 2020-07-12 NOTE — Op Note (Addendum)
Operative Note  Preoperative diagnosis:  1.  BPH with bladder outlet obstruction  Postoperative diagnosis: 1.  BPH with bladder outlet obstruction  Procedure(s): 1.  Bipolar transurethral resection of prostate  Surgeon: Jettie Pagan, MD  Assistants:  Cherlyn Labella, MD  Anesthesia:  General  Complications:  None  EBL:  69ml  Specimens: 1. Prostate chips ID Type Source Tests Collected by Time Destination  1 : prostate chips Tissue PATH GU resection / TURBT / partial nephrectomy SURGICAL PATHOLOGY Jannifer Hick, MD 07/12/2020 1347     Drains/Catheters: 1.  22Fr 3 way catheter with 80ml water into balloon  Intraoperative findings:   Obstructing bilobar friable prostate with urolift tines in place. Successful TURP with resection to the level of the capsule and removal of urolift tines. Wide open prostatic fossa at the end of the case. Excellent hemostasis.  Indication:  Terry Graham is a 65 y.o. male with BPH with bladder outlet obstruction presenting for transurethral resection of the prostate. He had a TRUS volume of 55. Although we planned for a HoLEP, unfortunately, the offset scope for the morcellator was not processed. We discussed options including cancelling and rescheduling or proceeding with a TURP and he elected to proceed with a TURP. After thorough discussion including all relevant risk benefits and alternatives, he presents today for a bipolar TURP.  Description of procedure: The indication, alternatives, benefits and risks were discussed with the patient and informed consent was obtained.  Patient was brought to the operating room table, positioned supine, secured with a safety strap.  Pneumatic compression devices were placed on the lower extremities.  After the administration of intravenous antibiotics and general anesthesia, the patient was repositioned into the dorsal lithotomy position.  All pressure points were carefully padded.  A rectal examination was  performed confirming a smooth symmetric enlarged gland.  The genitalia were prepped and draped in standard sterile manner.  A timeout was completed, verifying the correct patient, surgical procedure and positioning prior to beginning the procedure.  Isotonic sodium chloride was used for irrigation.  A 26 French continuous-flow resectoscope sheath with the visual obturator and a 30 degree lens was advanced under direct vision into the bladder.  The anterior urethra appeared normal in its entirety.The prostatic urethra was elongated with bilobar hyperplasia.  On cystoscopic evaluation, his bladder capacity appeared normal, the bladder wall was noted to expand symmetrically in all dimensions.  There were no tumors, stones or foreign bodies present. The bladder was mildly trabeculated with normal-appearing mucosa.  Both ureteral orifices were in their normal anatomic positions with clear urinary reflux noted bilaterally.  The obturator was removed and replaced by the working element with a resection loop.  The location of the ureteral orifices and the prostatic configuration were again confirmed.  Starting at the bladder neck and proceeding distally to the verumontanum a transurethral section of the prostate was performed using bipolar using energy of 4 and 5 for cutting and coagulation, respectively.  The procedure began at the bladder neck at the 5 o'clock and 7 o'clock positions and carefully carried distally to the verumontanum, resecting the intervening prostatic adenoma.  Next the left lateral lobe was resected to the level of the transverse capsular fibers.  The identical procedure was performed on the right lobe.  Attention was then directed anteriorly and the resection was completed from the 10 o'clock to 2 o'clock positions.  All bleeding vessels were fulgurated achieving meticulous hemostasis.  The bladder was irrigated with a Toomey syringe,  ensuring removal of all prostate chips which were sent to  pathology for evaluation.  Having completed the resection and the chips removed, we again confirmed hemostasis with the loop with coagulating current.  Upon completion of the entire procedure, the bladder and posterior urethra were reexamined, confirming open prostatic urethra and bladder neck without evidence of bleeding or perforation.  Both ureteral orifices and the external sphincter were noted to be intact.  The resectoscope was withdrawn under direct vision and a 22 French three-way Foley catheter with a 30 cc balloon was inserted into the bladder via the aid of a guidewire given an elevated bladder neck.  The balloon was inflated with 50 cc of sterile water.  After multiple manual irrigations ensuring clear to light pink return of the irrigant, the procedure was terminated.  The catheter was attached to a drainage bag and continuous bladder irrigation was started with normal saline.  The patient was positioned supine.  At the end of the procedure, all counts were correct.  Patient tolerated the procedure well and was taken to the recovery room satisfactory condition.  Plan: Continuous bladder irrigation overnight.  Plan to discharge home tomorrow with Foley catheter in place and void trial in the office in 3 days.  Matt R. Takela Varden MD [facility]  Pager: [phone]

## 2020-07-12 NOTE — Anesthesia Preprocedure Evaluation (Addendum)
Anesthesia Evaluation  Patient identified by MRN, date of birth, ID band Patient awake    Reviewed: Allergy & Precautions, NPO status , Patient's Chart, lab work & pertinent test results  History of Anesthesia Complications (+) PONV  Airway Mallampati: I  TM Distance: >3 FB Neck ROM: Full    Dental  (+) Dental Advisory Given   Pulmonary COPD,  COPD inhaler,  07/08/2020 SARS coronavirus NEG   breath sounds clear to auscultation       Cardiovascular negative cardio ROS   Rhythm:Regular Rate:Bradycardia     Neuro/Psych negative neurological ROS  negative psych ROS   GI/Hepatic GERD  Controlled,(+) Hepatitis -, C, B  Endo/Other  negative endocrine ROS  Renal/GU negative Renal ROS   BPH    Musculoskeletal  (+) Arthritis ,   Abdominal   Peds  Hematology negative hematology ROS (+)   Anesthesia Other Findings   Reproductive/Obstetrics                            Anesthesia Physical Anesthesia Plan  ASA: 2  Anesthesia Plan: General   Post-op Pain Management:    Induction: Intravenous  PONV Risk Score and Plan: 3  Airway Management Planned: Oral ETT  Additional Equipment: None  Intra-op Plan:   Post-operative Plan: Extubation in OR  Informed Consent: I have reviewed the patients History and Physical, chart, labs and discussed the procedure including the risks, benefits and alternatives for the proposed anesthesia with the patient or authorized representative who has indicated his/her understanding and acceptance.     Dental advisory given  Plan Discussed with: CRNA and Surgeon  Anesthesia Plan Comments:        Anesthesia Quick Evaluation

## 2020-07-12 NOTE — H&P (Addendum)
Terry Graham  MRN: [id number]  DOB: 21-Oct-1955, 65 year old Male  SSN:    PRIMARY CARE:    REFERRING:  Crist FatBenjamin W. Herrick, MD  PROVIDER:  Berniece SalinesBenjamin Herrick, M.D.  TREATING:  Jettie PaganMatthew Khyler Eschmann, M.D.  LOCATION:  [facility] [phone]     --------------------------------------------------------------------------------   CC/HPI: Terry Graham is a 65 year old male seen in consultation today for consideration of HoLEP.   He has a long history of BPH with LUTS. He was previously followed in [facility]. He underwent a UroLift in 2019 after seeing a benefit with Rapaflo and daily tadalafil. He was then seen in [facility] by Dr. Marlou PorchHerrick in 08/2019. He was transitioned to tamsulosin 0.4 mg. Patient has not noticed much benefit with medication. His IPSS score today is 31, quality of life 5. He complains of a sensation of incomplete bladder emptying. He also reports urinary frequency, intermittency, urgency, weak flow stream and straining to void. He has 2 time nocturia. He is interested in a surgical procedure and research either TURP or HoLEP. He is interested in an durable procedure.   He does have a history of an elevated PSA. His PSA on 02/16/2020 was 4.71. He underwent TRUS biopsy on 04/06/2020 with TRUS volume of 55 g and pathology reviewed revealing benign prostatic tissue in all cores.   Patient currently denies fever, chills, sweats, nausea, vomiting, abdominal or flank pain, gross hematuria or dysuria.     ALLERGIES: Doxycycline Sulfa Drugs    MEDICATIONS: Tamsulosin Hcl 0.4 mg capsule 1 capsule PO Daily  Ultram 50 mg tablet 1-2 tablet PO Q 6 H  Ambien 10 mg tablet  Fish Oil  Pravastatin Sodium 40 mg tablet  Tadalafil 5 mg tablet 1 tablet PO Daily PRN  Vitamin B12  Vitamin C  Vitamin D3     GU PSH: Prostate Needle Biopsy - 04/06/2020     NON-GU PSH: Knee replacement, Left, 2010 Rotator Cuff Surgery.., Left, 1998 Surgical Pathology, Gross  And Microscopic Examination For Prostate Needle - 04/06/2020     GU PMH: BPH w/LUTS - 04/12/2020, - 01/08/2020, - 09/29/2019, - 09/11/2019, - 08/19/2019 Elevated PSA - 04/12/2020, - 04/06/2020, - 02/23/2020, - 01/08/2020 Urinary Frequency - 04/12/2020, - 01/08/2020, - 09/29/2019, - 09/11/2019, - 08/19/2019 Weak Urinary Stream - 04/12/2020, - 02/23/2020, - 01/08/2020, - 09/29/2019, - 09/11/2019, - 08/19/2019 Urge incontinence - 02/23/2020, - 09/29/2019, - 09/11/2019, - 08/28/2019    NON-GU PMH: Muscle weakness (generalized) - 09/29/2019, - 09/11/2019, - 08/28/2019 Other muscle spasm - 09/29/2019, - 09/11/2019, - 08/28/2019 Other specified disorders of muscle - 09/29/2019, - 09/11/2019, - 08/28/2019    FAMILY HISTORY: 2 sons - Son Congestive Heart Failure - Mother Deceased - Father, Mother Heart Attack - Father Heart Disease - Mother, Father Hypertension - Brother, Mother    Notes: Mother deceased at age 687, father deceased at age 67   SOCIAL HISTORY: Marital Status: Married Preferred Language: English; Ethnicity: Not Hispanic Or Latino; Race: White Current Smoking Status: Patient has never smoked.   Tobacco Use Assessment Completed: Used Tobacco in last 30 days? Drinks 3 caffeinated drinks per day. Patient's occupation is/was Self-employed.    REVIEW OF SYSTEMS:    GU Review Male:   Patient denies frequent urination, hard to postpone urination, burning/ pain with urination, get up at night to urinate, leakage of urine, stream starts and stops, trouble starting your stream, have to strain to urinate , erection problems, and penile pain.  Gastrointestinal (  Upper):   Patient denies nausea, vomiting, and indigestion/ heartburn.  Gastrointestinal (Lower):   Patient denies diarrhea and constipation.  Constitutional:   Patient denies fever, night sweats, weight loss, and fatigue.  Skin:   Patient denies skin rash/ lesion and itching.  Eyes:   Patient denies blurred vision and double vision.  Ears/ Nose/ Throat:   Patient denies  sore throat and sinus problems.  Hematologic/Lymphatic:   Patient denies swollen glands and easy bruising.  Cardiovascular:   Patient denies leg swelling and chest pains.  Respiratory:   Patient denies cough and shortness of breath.  Endocrine:   Patient denies excessive thirst.  Musculoskeletal:   Patient denies back pain and joint pain.  Neurological:   Patient denies headaches and dizziness.  Psychologic:   Patient denies depression and anxiety.   VITAL SIGNS: None   MULTI-SYSTEM PHYSICAL EXAMINATION:    Constitutional: Well-nourished. No physical deformities. Normally developed. Good grooming.  Respiratory: No labored breathing, no use of accessory muscles.   Cardiovascular: Normal temperature, normal extremity pulses, no swelling, no varicosities.  Gastrointestinal: No mass, no tenderness, no rigidity, non obese abdomen.     Complexity of Data:  Source Of History:  Patient, Medical Record Summary  Lab Test Review:   PSA  Records Review:   AUA Symptom Score, Pathology Reports, Previous Patient Records  Urine Test Review:   Urinalysis   02/16/20  PSA  Total PSA 4.71 ng/mL    PROCEDURES:         PVR Ultrasound - 38250  Scanned Volume: 26 cc         Urinalysis - 81003 Dipstick Dipstick Cont'd  Color: Yellow Bilirubin: Neg  Appearance: Clear Ketones: Neg  Specific Gravity: 1.025 Blood: Neg  pH: 6.0 Protein: Neg  Glucose: Neg Urobilinogen: 0.2    Nitrites: Neg    Leukocyte Esterase: Neg    Notes:      ASSESSMENT:      ICD-10 Details  1 GU:   BPH w/LUTS - N40.1   2   Elevated PSA - R97.20   3   Urinary Frequency - R35.0    PLAN:           Document Letter(s):  Created for Patient: Clinical Summary         Notes:   #1. BPH with LUTS: IPSS score 31. TRUS volume 55 g. I discussed surgical options including TURP, HOLEP, PVP. He has already had a UroLift. After considering his options, he would like to proceed with HoLEP. We discussed that sometimes during this  procedure, we may need to convert to a TURP. He is aware that we may uncover undiagnosed prostate cancer in specimen. He is reassured that his recent TRUS pathology resulted benign.   Risks and benefits of Holmium Laser Enucleation of the Prostate were reviewed in detail including infection, bleeding, blood transfusion, injury to bladder/urethra/surrounding structures, erectile dysfunction, urinary incontinence, bladder neck contracture, persistent obstructive and irritative voiding symptoms, and global anesthesia risks including but not limited to CVA, MI, DVT, PE, pneumonia, and death. He expressed understanding and desire to proceed.   2. Elevated PSA:  -PSA on 02/16/2020 was 4.71  -S/p TRUS biopsy 04/06/2020 with benign prostatic tissue in all cores  -Recommend annual PSA evaluation.   CC: Berniece Salines, MD   Urology Preoperative H&P   Chief Complaint: BPH  History of Present Illness: Terry Graham is a 65 y.o. male with BPH here for HoLEP. Denies fevers or chills.  Past Medical History:  Diagnosis Date   BPH (benign prostatic hypertrophy)    Eczema    Exercise-induced asthma    GERD (gastroesophageal reflux disease)    Gout    per pt currently stable 05-26-2015   History of hepatitis B    per pt postitive blood test yrs ago but last test negative   History of positive hepatitis C    positive via blood test and treated 2005 ,  cured   Labral tear of shoulder    right   Nocturia    OA (osteoarthritis)    knees, shoulders,  and right shouler AC joint   Pneumonia    PONV (postoperative nausea and vomiting)    Right rotator cuff tear    Wears contact lenses     Past Surgical History:  Procedure Laterality Date   CYST REMOVAL LEG     cyst removed left ankle and wrist   CYSTOSCOPY WITH INSERTION OF UROLIFT     KNEE ARTHROSCOPY Left 09/03/1978   right knee arthroscopy      cleaned out several times   RIGHT KNEE SURGERY'S  x12   starting at age 89    includes  ACL  repair/ MCL repair/ multiple menisectomy's   SHOULDER ARTHROSCOPY WITH ROTATOR CUFF REPAIR Left x2   last one 2012   SHOULDER ARTHROSCOPY WITH ROTATOR CUFF REPAIR AND SUBACROMIAL DECOMPRESSION Right 05/27/2015   Procedure: RIGHT SHOULDER ARTHROSCOPY WITH DEBRIDEMENT, ROTATOR CUFF REPAIR AND SUBACROMIAL DECOMPRESSION, DISTAL CLAVICLE RESECTION;  Surgeon: Eugenia Mcalpine, MD;  Location: [facility];  Service: Orthopedics;  Laterality: Right;   TOTAL KNEE ARTHROPLASTY Bilateral right  2005/  left 2010    Allergies:  Allergies  Allergen Reactions   Biaxin [Clarithromycin] Rash   Ciprofloxacin Rash   Sulfa Antibiotics Rash   Tetracyclines & Related Rash    History reviewed. No pertinent family history.  Social History:  reports that he has never smoked. His smokeless tobacco use includes snuff. He reports previous drug use. He reports that he does not drink alcohol.  ROS: A complete review of systems was performed.  All systems are negative except for pertinent findings as noted.  Physical Exam:  Vital signs in last 24 hours: Temp:  [98 F (36.7 C)] 98 F (36.7 C) (07/11 1047) Pulse Rate:  [48] 48 (07/11 1047) Resp:  [20] 20 (07/11 1047) BP: (155)/(77) 155/77 (07/11 1047) SpO2:  [100 %] 100 % (07/11 1047) Weight:  [82.1 kg] 82.1 kg (07/11 1022) Constitutional:  Alert and oriented, No acute distress Cardiovascular: Regular rate and rhythm Respiratory: Normal respiratory effort, Lungs clear bilaterally GI: Abdomen is soft, nontender, nondistended, no abdominal masses GU: No CVA tenderness Lymphatic: No lymphadenopathy Neurologic: Grossly intact, no focal deficits Psychiatric: Normal mood and affect  Laboratory Data:  No results for input(s): WBC, HGB, HCT, PLT in the last 72 hours.  No results for input(s): NA, K, CL, GLUCOSE, BUN, CALCIUM, CREATININE in the last 72 hours.  Invalid input(s): CO3   No results found for this or any previous visit (from the past  24 hour(s)). Recent Results (from the past 240 hour(s))  SARS CORONAVIRUS 2 (TAT 6-24 HRS) Nasopharyngeal Nasopharyngeal Swab     Status: None   Collection Time: 07/08/20  9:10 AM   Specimen: Nasopharyngeal Swab  Result Value Ref Range Status   SARS Coronavirus 2 NEGATIVE NEGATIVE Final    Comment: (NOTE) SARS-CoV-2 target nucleic acids are NOT DETECTED.  The SARS-CoV-2 RNA is generally  detectable in upper and lower respiratory specimens during the acute phase of infection. Negative results do not preclude SARS-CoV-2 infection, do not rule out co-infections with other pathogens, and should not be used as the sole basis for treatment or other patient management decisions. Negative results must be combined with clinical observations, patient history, and epidemiological information. The expected result is Negative.  Fact Sheet for Patients: HairSlick.no  Fact Sheet for Healthcare Providers: quierodirigir.com  This test is not yet approved or cleared by the [place] FDA and  has been authorized for detection and/or diagnosis of SARS-CoV-2 by FDA under an Emergency Use Authorization (EUA). This EUA will remain  in effect (meaning this test can be used) for the duration of the COVID-19 declaration under Se ction 564(b)(1) of the Act, 21 U.S.C. section 360bbb-3(b)(1), unless the authorization is terminated or revoked sooner.  Performed at [facility], [address], [place], [place] [address]     Renal Function: No results for input(s): CREATININE in the last 168 hours. Estimated Creatinine Clearance: 79.9 mL/min (by C-G formula based on SCr of 0.92 mg/dL).  Radiologic Imaging: No results found.  I independently reviewed the above imaging studies.  Assessment and Plan Terry Graham is a 65 y.o. male with BPH here for HoLEP.  Risks and benefits of Holmium Laser Enucleation of the Prostate were reviewed in  detail including infection, bleeding, blood transfusion, injury to bladder/urethra/surrounding structures, erectile dysfunction, urinary incontinence, bladder neck contracture, persistent obstructive and irritative voiding symptoms, and global anesthesia risks including but not limited to CVA, MI, DVT, PE, pneumonia, and death. He expressed understanding and desire to proceed.  Addendum 1:20PM: Right before patient was being brought back, it was noticed that the offset nephroscope was not in the tray. Although the rep reassured me that all of the equipment was brought last week for sterilization, it appears that the off-set nephroscope was lost during processing. I conveyed my frustration with the circumstances with the patient and presented options of cancelling and rescheduling of proceeding with a TURP. He elects to proceed with a TURP. Discussed risks, benefits, side effects. He agrees to proceed.  Matt R. Teresita Fanton MD 07/12/2020, 11:31 AM  [facility] Pager: [phone]): [phone]

## 2020-07-12 NOTE — Anesthesia Postprocedure Evaluation (Signed)
Anesthesia Post Note  Patient: Terry Graham  Procedure(s) Performed: TRANSURETHRAL RESECTION OF THE PROSTATE (Prostate)     Patient location during evaluation: PACU Anesthesia Type: General Level of consciousness: awake and alert, patient cooperative and oriented Pain management: pain level controlled Vital Signs Assessment: post-procedure vital signs reviewed and stable Respiratory status: spontaneous breathing, nonlabored ventilation and respiratory function stable Cardiovascular status: blood pressure returned to baseline and stable Postop Assessment: no apparent nausea or vomiting Anesthetic complications: no   No notable events documented.  Last Vitals:  Vitals:   07/12/20 1530 07/12/20 1545  BP: 137/70 133/77  Pulse: (!) 51 (!) 50  Resp: 10 10  Temp:    SpO2: 96% 95%    Last Pain:  Vitals:   07/12/20 1530  TempSrc:   PainSc: 3                  Ashly Yepez,E. Thelton Graca

## 2020-07-12 NOTE — Anesthesia Procedure Notes (Signed)
Procedure Name: Intubation Date/Time: 07/12/2020 1:31 PM Performed by: Jari Pigg, CRNA Pre-anesthesia Checklist: Patient identified, Emergency Drugs available, Suction available and Patient being monitored Patient Re-evaluated:Patient Re-evaluated prior to induction Oxygen Delivery Method: Circle system utilized Preoxygenation: Pre-oxygenation with 100% oxygen Induction Type: IV induction Ventilation: Mask ventilation without difficulty Laryngoscope Size: Mac and 4 Grade View: Grade II Tube type: Oral Tube size: 7.5 mm Number of attempts: 1 Airway Equipment and Method: Stylet and Oral airway Placement Confirmation: ETT inserted through vocal cords under direct vision, positive ETCO2 and breath sounds checked- equal and bilateral Tube secured with: Tape Dental Injury: Teeth and Oropharynx as per pre-operative assessment

## 2020-07-13 ENCOUNTER — Other Ambulatory Visit: Payer: Self-pay

## 2020-07-13 DIAGNOSIS — R350 Frequency of micturition: Secondary | ICD-10-CM | POA: Diagnosis not present

## 2020-07-13 DIAGNOSIS — N401 Enlarged prostate with lower urinary tract symptoms: Secondary | ICD-10-CM | POA: Diagnosis not present

## 2020-07-13 DIAGNOSIS — Z96653 Presence of artificial knee joint, bilateral: Secondary | ICD-10-CM | POA: Diagnosis not present

## 2020-07-13 LAB — BASIC METABOLIC PANEL
Anion gap: 9 (ref 5–15)
BUN: 13 mg/dL (ref 8–23)
CO2: 22 mmol/L (ref 22–32)
Calcium: 9 mg/dL (ref 8.9–10.3)
Chloride: 104 mmol/L (ref 98–111)
Creatinine, Ser: 1.07 mg/dL (ref 0.61–1.24)
GFR, Estimated: >60 mL/min (ref >60)
Glucose, Bld: 159 mg/dL — ABNORMAL HIGH (ref 70–99)
Potassium: 4.6 mmol/L (ref 3.5–5.1)
Sodium: 135 mmol/L (ref 135–145)

## 2020-07-13 LAB — SURGICAL PATHOLOGY

## 2020-07-13 LAB — CBC
HCT: 39.4 % (ref 39.0–52.0)
Hemoglobin: 13.4 g/dL (ref 13.0–17.0)
MCH: 31.2 pg (ref 26.0–34.0)
MCHC: 34 g/dL (ref 30.0–36.0)
MCV: 91.6 fL (ref 80.0–100.0)
Platelets: 169 10*3/uL (ref 150–400)
RBC: 4.3 MIL/uL (ref 4.22–5.81)
RDW: 13.4 % (ref 11.5–15.5)
WBC: 7.4 10*3/uL (ref 4.0–10.5)
nRBC: 0 % (ref 0.0–0.2)

## 2020-07-13 MED ORDER — MENTHOL 3 MG MT LOZG
1.0000 | LOZENGE | OROMUCOSAL | Status: DC | PRN
  Administered 2020-07-13 (×2): 3 mg via ORAL
  Filled 2020-07-13: qty 9

## 2020-07-13 NOTE — Progress Notes (Signed)
Obtained 12-lead EKG. Copy placed in patient's shadow chart. Patient not complaining of any chest and states that he feels fine.

## 2020-07-13 NOTE — Plan of Care (Signed)
  Problem: Skin Integrity: Goal: Demonstration of wound healing without infection will improve Outcome: Adequate for Discharge   Problem: Clinical Measurements: Goal: Ability to maintain clinical measurements within normal limits will improve Outcome: Adequate for Discharge   Problem: Activity: Goal: Risk for activity intolerance will decrease Outcome: Adequate for Discharge   Problem: Nutrition: Goal: Adequate nutrition will be maintained Outcome: Adequate for Discharge   Problem: Elimination: Goal: Will not experience complications related to bowel motility Outcome: Adequate for Discharge

## 2020-07-13 NOTE — Plan of Care (Signed)

## 2020-07-13 NOTE — Progress Notes (Signed)
Patient's pulse was 45 the last two times vitals have been taken. Spoke to Careers adviser about it. Patient states that this is not new for him and has seen a doctor about it. Patient has also been up two times to ambulate in the halls during night shift, as well as been given an incentive spirometer.

## 2020-07-13 NOTE — Progress Notes (Addendum)
Urology Progress Note   1 Day Post-Op from transurethral resection of the prostate with Dr. Cardell Peach.   Subjective: NAEON.  Bradycardia with heart rate into the 40s and 50s.  Per patient this is baseline for him.  It has been checked by cardiologist before.  Getting an EKG to make sure sinus.  Otherwise feels well.  Urine is very light clear pink this morning on slow moderate CBI.  Clamped at the bedside.  Hemoglobin is stable.  Objective: Vital signs in last 24 hours: Temp:  [97.6 F (36.4 C)-98.2 F (36.8 C)] 98 F (36.7 C) (07/12 0546) Pulse Rate:  [45-55] 45 (07/12 0546) Resp:  [10-20] 16 (07/12 0546) BP: (110-155)/(67-86) 115/72 (07/12 0546) SpO2:  [94 %-100 %] 94 % (07/12 0546) Weight:  [82.1 kg] 82.1 kg (07/11 1022)  Intake/Output from previous day: 07/11 0701 - 07/12 0700 In: 2202.7 [P.O.:360; I.V.:1692.7; IV Piggyback:150] Out: 47425 [Urine:15700] Intake/Output this shift: Total I/O In: 1302.7 [P.O.:360; I.V.:892.7; IV Piggyback:50] Out: 95638 [Urine:14250]  Physical Exam:  General: Alert and oriented CV: Regular rate Lungs: No increased work of breathing Abdomen: Soft nontender GU: Foley in place draining clear pink urine with CBI clamped Ext: NT, No erythema  Lab Results: Recent Labs    07/12/20 1536 07/13/20 0331  HGB 13.9 13.4  HCT 41.2 39.4   Recent Labs    07/13/20 0331  NA 135  K 4.6  CL 104  CO2 22  GLUCOSE 159*  BUN 13  CREATININE 1.07  CALCIUM 9.0    Studies/Results: No results found.  Assessment/Plan:  65 y.o. male s/p TURP.  Overall doing well post-op.   -CBI clamped today -Trial of void in 3 days arranged -As needed p.o. analgesics and antibladder spasmodics  Dispo: Likely home today   LOS: 0 days   I have seen and examined the patient and agree with the above assessment and plan.  Pain controlled. Foley clamped and is clear to slight pink tinge. Hgb stable. Appropriate to discharge home with VT on Thursday.  Matt R. Caitlinn Klinker  MD [facility]  Pager: [phone]

## 2021-03-16 ENCOUNTER — Encounter (HOSPITAL_BASED_OUTPATIENT_CLINIC_OR_DEPARTMENT_OTHER): Payer: Self-pay | Admitting: Urology

## 2021-03-16 ENCOUNTER — Other Ambulatory Visit: Payer: Self-pay

## 2021-03-16 ENCOUNTER — Emergency Department (HOSPITAL_BASED_OUTPATIENT_CLINIC_OR_DEPARTMENT_OTHER): Payer: Medicare Other

## 2021-03-16 ENCOUNTER — Emergency Department (HOSPITAL_BASED_OUTPATIENT_CLINIC_OR_DEPARTMENT_OTHER)
Admission: EM | Admit: 2021-03-16 | Discharge: 2021-03-16 | Disposition: A | Discharge status: Home / Self Care | Payer: Medicare Other | Attending: Emergency Medicine | Admitting: Emergency Medicine

## 2021-03-16 DIAGNOSIS — S81811A Laceration without foreign body, right lower leg, initial encounter: Secondary | ICD-10-CM

## 2021-03-16 DIAGNOSIS — Z23 Encounter for immunization: Secondary | ICD-10-CM | POA: Insufficient documentation

## 2021-03-16 DIAGNOSIS — W01198A Fall on same level from slipping, tripping and stumbling with subsequent striking against other object, initial encounter: Secondary | ICD-10-CM | POA: Insufficient documentation

## 2021-03-16 DIAGNOSIS — R030 Elevated blood-pressure reading, without diagnosis of hypertension: Secondary | ICD-10-CM

## 2021-03-16 DIAGNOSIS — S8992XA Unspecified injury of left lower leg, initial encounter: Secondary | ICD-10-CM | POA: Diagnosis present

## 2021-03-16 DIAGNOSIS — S40912A Unspecified superficial injury of left shoulder, initial encounter: Secondary | ICD-10-CM | POA: Diagnosis not present

## 2021-03-16 DIAGNOSIS — S81812A Laceration without foreign body, left lower leg, initial encounter: Secondary | ICD-10-CM | POA: Insufficient documentation

## 2021-03-16 DIAGNOSIS — Z7982 Long term (current) use of aspirin: Secondary | ICD-10-CM | POA: Diagnosis not present

## 2021-03-16 DIAGNOSIS — S46912A Strain of unspecified muscle, fascia and tendon at shoulder and upper arm level, left arm, initial encounter: Secondary | ICD-10-CM

## 2021-03-16 MED ORDER — LIDOCAINE-EPINEPHRINE 2 %-1:100000 IJ SOLN
20.0000 mL | Freq: Once | INTRAMUSCULAR | Status: AC
  Administered 2021-03-16: 20 mL via INTRADERMAL

## 2021-03-16 MED ORDER — LIDOCAINE-EPINEPHRINE (PF) 2 %-1:200000 IJ SOLN
INTRAMUSCULAR | Status: AC
  Filled 2021-03-16: qty 20

## 2021-03-16 MED ORDER — TETANUS-DIPHTH-ACELL PERTUSSIS 5-2.5-18.5 LF-MCG/0.5 IM SUSY
0.5000 mL | PREFILLED_SYRINGE | Freq: Once | INTRAMUSCULAR | Status: AC
  Administered 2021-03-16: 0.5 mL via INTRAMUSCULAR
  Filled 2021-03-16: qty 0.5

## 2021-03-16 NOTE — ED Notes (Signed)
Pressure dressing reapplied to right shin x 2 due to complete blood saturation to previous dressing ?

## 2021-03-16 NOTE — ED Triage Notes (Signed)
Pt reports fall this am when getting golf clubs out of truck ?States left shoulder pain with h/o rotator cuff repair to same shoulder ?Laceration to right shin, bleeding controlled with pressure dressing  ? ?

## 2021-03-16 NOTE — ED Provider Notes (Signed)
?[facility] EMERGENCY DEPARTMENT ?Provider Note ? ? ?CSN: [id number] ?Arrival date & time: 03/16/21  1237 ? ?  ? ?History ? ?Chief Complaint  ?Patient presents with  ? Fall  ? Laceration  ? ? ?Terry Graham is a 66 y.o. male here with cc of fall. This occurred 4 hours PT evaluation. Patient slipped off of the back of his truck bed on a hill. He hit his R shin on the back of the truck bed and suffered a laceration. He also wrenched his L shoulder when he grabbed the wire on the back of the truck. He has a hx of previous arthroscopic shoulder surgery. He did not hit is head. He has no other injuries. He is not sure of his last tetanus. ? ?The history is provided by the patient.  ?Fall ? ?Laceration ? ?  ? ?Home Medications ?Prior to Admission medications   ?Medication Sig Start Date End Date Taking? Authorizing Provider  ?albuterol (VENTOLIN HFA) 108 (90 Base) MCG/ACT inhaler Inhale 2 puffs into the lungs every 6 (six) hours as needed for wheezing or shortness of breath.    [provider]  ?aspirin EC 81 MG tablet Take 81 mg by mouth daily. Swallow whole.    [provider]  ?Cholecalciferol (VITAMIN D) 50 MCG (2000 UT) tablet Take 2,000 Units by mouth daily.    [provider]  ?Coenzyme Q10 (COQ10) 200 MG CAPS Take 200 mg by mouth daily.    [provider]  ?docusate sodium (COLACE) 100 MG capsule Take 1 capsule (100 mg total) by mouth daily as needed for up to 30 doses. 07/12/20   Jannifer HickGay, Matthew R, MD  ?fluocinonide cream (LIDEX) 0.05 % Apply 1 application topically 2 (two) times daily as needed (rash).    [provider]  ?indomethacin (INDOCIN) 50 MG capsule Take 50 mg by mouth 3 (three) times daily as needed (gout).    [provider]  ?oxyCODONE-acetaminophen (PERCOCET) 5-325 MG tablet Take 1 tablet by mouth every 4 (four) hours as needed for up to 18 doses for severe pain. 07/12/20   Jannifer HickGay, Matthew R, MD  ?rosuvastatin (CRESTOR) 20 MG tablet Take 20  mg by mouth daily.    [provider]  ?tadalafil (CIALIS) 5 MG tablet Take 5 mg by mouth every morning.    [provider]  ?tamsulosin (FLOMAX) 0.4 MG CAPS capsule Take 0.4 mg by mouth daily.    [provider]  ?vitamin B-12 (CYANOCOBALAMIN) 1000 MCG tablet Take 1,000 mcg by mouth daily.    [provider]  ?zolpidem (AMBIEN) 10 MG tablet Take 10 mg by mouth at bedtime as needed for sleep.    [provider]  ?   ? ?Allergies    ?Biaxin [clarithromycin], Ciprofloxacin, Sulfa antibiotics, and Tetracyclines & related   ? ?Review of Systems   ?Review of Systems ? ?Physical Exam ?Updated Vital Signs ?BP (!) 192/119 (BP Location: Left Arm)   Pulse (!) 59   Temp 98 ?F (36.7 ?C) (Oral)   Resp 20   Ht 5\' 7"  (1.702 m)   Wt 82.1 kg   SpO2 100%   BMI 28.35 kg/m?  ?Physical Exam ?Vitals and nursing note reviewed.  ?Constitutional:   ?   General: He is not in acute distress. ?   Appearance: He is well-developed. He is not diaphoretic.  ?HENT:  ?   Head: Normocephalic and atraumatic.  ?Eyes:  ?   General: No scleral icterus. ?  Conjunctiva/sclera: Conjunctivae normal.  ?Cardiovascular:  ?   Rate and Rhythm: Normal rate and regular rhythm.  ?   Heart sounds: Normal heart sounds.  ?Pulmonary:  ?   Effort: Pulmonary effort is normal. No respiratory distress.  ?   Breath sounds: Normal breath sounds.  ?Abdominal:  ?   Palpations: Abdomen is soft.  ?   Tenderness: There is no abdominal tenderness.  ?Musculoskeletal:  ?   Cervical back: Normal range of motion and neck supple.  ?   Comments: Left shoulder with decreased range of motion and strength with pain to the posterior shoulder.  Pain is increased with abduction and external rotation otherwise normal sensation and normal 2+ radial pulse. ?R shin with 6 cm laceration + arterial bleeding.  ?Skin: ?   General: Skin is warm and dry.  ?Neurological:  ?   Mental Status: He is alert.  ?Psychiatric:     ?   Behavior: Behavior normal.   ? ?ED Results / Procedures / Treatments   ?Labs ?(all labs ordered are listed, but only abnormal results are displayed) ?Labs Reviewed - No data to display ? ?EKG ?None ? ?Radiology ?DG Shoulder Left ? ?Result Date: 03/16/2021 ?CLINICAL DATA:  Larey Seat trying to get into his truck, fell back onto LEFT shoulder, pain with abduction EXAM: LEFT SHOULDER - 2+ VIEW COMPARISON:  None FINDINGS: Osseous mineralization low normal. AC joint alignment normal. Visualized LEFT ribs intact. No acute fracture, dislocation, or bone destruction. IMPRESSION: No acute osseous abnormalities. Electronically Signed   By: Ulyses Southward M.D.   On: 03/16/2021 13:34   ? ?Procedures ?Marland Kitchen.Laceration Repair ? ?Date/Time: 03/16/2021 4:22 PM ?Performed by: Arthor Captain, PA-C ?Authorized by: Arthor Captain, PA-C  ? ?Consent:  ?  Consent obtained:  Verbal ?  Consent given by:  Patient ?  Risks discussed:  Infection, need for additional repair, pain, poor cosmetic result and poor wound healing ?  Alternatives discussed:  No treatment and delayed treatment ?Universal protocol:  ?  Procedure explained and questions answered to patient or proxy's satisfaction: yes   ?  Relevant documents present and verified: yes   ?  Test results available: yes   ?  Imaging studies available: yes   ?  Required blood products, implants, devices, and special equipment available: yes   ?  Site/side marked: yes   ?  Immediately prior to procedure, a time out was called: yes   ?  Patient identity confirmed:  Verbally with patient ?Anesthesia:  ?  Anesthesia method:  Local infiltration ?  Local anesthetic:  Lidocaine 2% WITH epi ?Laceration details:  ?  Location:  Leg ?  Leg location:  L lower leg ?  Length (cm):  6 ?  Depth (mm):  10 ?Pre-procedure details:  ?  Preparation:  Patient was prepped and draped in usual sterile fashion ?Exploration:  ?  Hemostasis achieved with:  Epinephrine and direct pressure ?  Wound exploration: wound explored through full range of motion   ?   Wound extent: vascular damage (arterial injury)   ?  Wound extent: no fascia violation noted, no foreign bodies/material noted, no muscle damage noted, no nerve damage noted, no tendon damage noted and no underlying fracture noted   ?Treatment:  ?  Area cleansed with:  Chlorhexidine ?  Amount of cleaning:  Standard ?  Irrigation solution:  Sterile saline ?  Irrigation method:  Pressure wash ?Skin repair:  ?  Repair method:  Sutures ?  Suture size:  3-0 ?  Suture material:  Prolene ?  Suture technique:  Horizontal mattress and simple interrupted ?  Number of sutures:  4 ?Repair type:  ?  Repair type:  Simple  ? ? ?Medications Ordered in ED ?Medications  ?lidocaine-EPINEPHrine (XYLOCAINE W/EPI) 2 %-1:100000 (with pres) injection 20 mL (20 mLs Intradermal Given by Other 03/16/21 1531)  ? ? ?ED Course/ Medical Decision Making/ A&P ?  ?                        ?Medical Decision Making ?Patient here with left shoulder injury.  I personally visualized the left shoulder x-ray which shows no acute findings.  Suspect rotator cuff injury particularly to teres minor and infraspinatus.  Patient has a shoulder sling at home and will follow-up with orthopedics ?Patient also had an injury to the right shin.  Initially patient had an arterial bleed and hemostasis was achieved with direct pressure and lidocaine with epinephrine.  We were able to repair the laceration with 3-0 Prolene.  Patient given home care and outpatient follow-up and return precautions for suture removal.  No concern for contaminated wound or underlying fracture.  Tdap updated.  Patient's wound bandaged with sterile dressing.  Appears appropriate for discharge with outpatient follow-up.  He is noted to have hypertension and advised to follow-up with his primary care physician. ? ?Amount and/or Complexity of Data Reviewed ?Radiology: ordered. ? ?Risk ?Prescription drug management. ? ? ? ? ? ?  ? ? ? ? ?Final Clinical Impression(s) / ED Diagnoses ?Final diagnoses:   ?None  ? ? ?Rx / DC Orders ?ED Discharge Orders   ? ? None  ? ?  ? ? ?  ?Arthor Captain, PA-C ?03/16/21 1625 ? ?  ?Pricilla Loveless, MD ?03/16/21 2324 ? ?

## 2021-03-16 NOTE — Discharge Instructions (Addendum)
Contact a health care provider if: ?Your pain gets worse. ?Your pain is not relieved with medicines. ?New pain develops in your arm, hand, or fingers. ?Get help right away if: ?Your arm, hand, or fingers: ?Tingle. ?Become numb. ?Become swollen. ?Become painful. ?Turn white or blue. ?WOUND CARE ?Please have your stitches/staples removed in 7 days or sooner if you have concerns. You may do this at any available urgent care or at your primary care doctor's office. ? Keep area clean and dry for 24 hours. Do not remove ?bandage, if applied. ? After 24 hours, remove bandage and wash wound ?gently with mild soap and warm water. Reapply ?a new bandage after cleaning wound, if directed. ? Continue daily cleansing with soap and water until ?stitches/staples are removed. ? Do not apply any ointments or creams to the wound ?while stitches/staples are in place, as this may cause ?delayed healing. ? Seek medical careif you experience any of the following ?signs of infection: Swelling, redness, pus drainage, ?streaking, fever >101.0 F ? Seek care if you experience excessive bleeding ?that does not stop after 15-20 minutes of constant, firm ?pressure. ? ? ?

## 2021-03-16 NOTE — ED Notes (Signed)
Pressure dressing reapplied to right shin due to complete blood saturation to previous dressing. ?

## 2021-03-23 ENCOUNTER — Emergency Department (HOSPITAL_BASED_OUTPATIENT_CLINIC_OR_DEPARTMENT_OTHER)
Admission: EM | Admit: 2021-03-23 | Discharge: 2021-03-23 | Disposition: A | Discharge status: Home / Self Care | Payer: Medicare Other | Attending: Emergency Medicine | Admitting: Emergency Medicine

## 2021-03-23 ENCOUNTER — Encounter (HOSPITAL_BASED_OUTPATIENT_CLINIC_OR_DEPARTMENT_OTHER): Payer: Self-pay | Admitting: *Deleted

## 2021-03-23 ENCOUNTER — Other Ambulatory Visit: Payer: Self-pay

## 2021-03-23 DIAGNOSIS — Z96653 Presence of artificial knee joint, bilateral: Secondary | ICD-10-CM | POA: Diagnosis not present

## 2021-03-23 DIAGNOSIS — S8991XD Unspecified injury of right lower leg, subsequent encounter: Secondary | ICD-10-CM | POA: Diagnosis present

## 2021-03-23 DIAGNOSIS — Z4802 Encounter for removal of sutures: Secondary | ICD-10-CM | POA: Diagnosis not present

## 2021-03-23 DIAGNOSIS — Z7982 Long term (current) use of aspirin: Secondary | ICD-10-CM | POA: Diagnosis not present

## 2021-03-23 DIAGNOSIS — X58XXXD Exposure to other specified factors, subsequent encounter: Secondary | ICD-10-CM | POA: Diagnosis not present

## 2021-03-23 DIAGNOSIS — S81801D Unspecified open wound, right lower leg, subsequent encounter: Secondary | ICD-10-CM | POA: Diagnosis not present

## 2021-03-23 NOTE — ED Triage Notes (Signed)
Here for suture removal of injury to rt ant RLE, tibial area. Sutures in place, slight redness noted at suture site. Pt states tender to touch. Denies any drainage or having any fevers.  ?

## 2021-03-23 NOTE — ED Provider Notes (Signed)
?[facility] EMERGENCY DEPARTMENT ?Provider Note ? ? ?CSN: [id number] ?Arrival date & time: 03/23/21  0354 ? ?  ? ?History ? ?Chief Complaint  ?Patient presents with  ? Suture / Staple Removal  ? ? ?Terry Graham is a 66 y.o. male. ? ?Patient here for suture removal from her right anterior leg.  They were placed on the 15th.  So it has been 7 days.  Patient states wound is healing well.  Note shows that 4 sutures were placed. ? ?Past history significant for hepatitis C hepatitis B benign prostatic hypertrophy rotator cuff injury labral tear of shoulder gastroesophageal reflux disease.  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 ? ? ?  ? ?Home Medications ?Prior to Admission medications   ?Medication Sig Start Date End Date Taking? Authorizing Provider  ?Coenzyme Q10 (COQ10) 200 MG CAPS Take 200 mg by mouth daily.   Yes [provider]  ?rosuvastatin (CRESTOR) 20 MG tablet Take 20 mg by mouth daily.   Yes [provider]  ?albuterol (VENTOLIN HFA) 108 (90 Base) MCG/ACT inhaler Inhale 2 puffs into the lungs every 6 (six) hours as needed for wheezing or shortness of breath.    [provider]  ?aspirin EC 81 MG tablet Take 81 mg by mouth daily. Swallow whole.    [provider]  ?Cholecalciferol (VITAMIN D) 50 MCG (2000 UT) tablet Take 2,000 Units by mouth daily.    [provider]  ?docusate sodium (COLACE) 100 MG capsule Take 1 capsule (100 mg total) by mouth daily as needed for up to 30 doses. 07/12/20   Jannifer Hick, MD  ?fluocinonide cream (LIDEX) 0.05 % Apply 1 application topically 2 (two) times daily as needed (rash).    [provider]  ?indomethacin (INDOCIN) 50 MG capsule Take 50 mg by mouth 3 (three) times daily as needed (gout).    [provider]  ?oxyCODONE-acetaminophen (PERCOCET) 5-325 MG tablet Take 1 tablet by mouth every 4 (four) hours as needed for up to 18 doses for  severe pain. 07/12/20   Jannifer Hick, MD  ?tadalafil (CIALIS) 5 MG tablet Take 5 mg by mouth every morning.    [provider]  ?tamsulosin (FLOMAX) 0.4 MG CAPS capsule Take 0.4 mg by mouth daily.    [provider]  ?vitamin B-12 (CYANOCOBALAMIN) 1000 MCG tablet Take 1,000 mcg by mouth daily.    [provider]  ?zolpidem (AMBIEN) 10 MG tablet Take 10 mg by mouth at bedtime as needed for sleep.    [provider]  ?   ? ?Allergies    ?Biaxin [clarithromycin], Ciprofloxacin, Sulfa antibiotics, and Tetracyclines & related   ? ?Review of Systems   ?Review of Systems  ?Constitutional:  Negative for chills and fever.  ?HENT:  Negative for ear pain and sore throat.   ?Eyes:  Negative for pain and visual disturbance.  ?Respiratory:  Negative for cough and shortness of breath.   ?Cardiovascular:  Negative for chest pain and palpitations.  ?Gastrointestinal:  Negative for abdominal pain and vomiting.  ?Genitourinary:  Negative for dysuria and hematuria.  ?Musculoskeletal:  Negative for arthralgias and back pain.  ?Skin:  Negative for color change and rash.  ?Neurological:  Negative for seizures and syncope.  ?All other systems reviewed and are negative. ? ?Physical Exam ?Updated Vital Signs ?BP (!) 170/86 (BP Location: Right Arm)   Pulse (!) 42   Temp 98.4 ?F (36.9 ?C) (  Oral)   Resp 16   SpO2 98%  ?Physical Exam ?Vitals and nursing note reviewed.  ?Constitutional:   ?   General: He is not in acute distress. ?   Appearance: He is well-developed.  ?HENT:  ?   Head: Normocephalic and atraumatic.  ?Eyes:  ?   Conjunctiva/sclera: Conjunctivae normal.  ?Cardiovascular:  ?   Rate and Rhythm: Normal rate and regular rhythm.  ?   Heart sounds: No murmur heard. ?Pulmonary:  ?   Effort: Pulmonary effort is normal. No respiratory distress.  ?   Breath sounds: Normal breath sounds.  ?Abdominal:  ?   Palpations: Abdomen is soft.  ?   Tenderness: There is no abdominal tenderness.  ?Musculoskeletal:      ?   General: No swelling.  ?   Cervical back: Neck supple.  ?   Right lower leg: No edema.  ?   Left lower leg: No edema.  ?   Comments: Wound to right anterior shin healing well scab in place.  Slight erythema around the edges no signs of any significant infection.  4 sutures in place  ?Skin: ?   General: Skin is warm and dry.  ?   Capillary Refill: Capillary refill takes less than 2 seconds.  ?Neurological:  ?   General: No focal deficit present.  ?   Mental Status: He is alert and oriented to person, place, and time.  ?Psychiatric:     ?   Mood and Affect: Mood normal.  ? ? ?ED Results / Procedures / Treatments   ?Labs ?(all labs ordered are listed, but only abnormal results are displayed) ?Labs Reviewed - No data to display ? ?EKG ?None ? ?Radiology ?No results found. ? ?Procedures ?Procedures  ? ? ?Medications Ordered in ED ?Medications - No data to display ? ?ED Course/ Medical Decision Making/ A&P ?  ?                        ?Medical Decision Making ? ?Sutures removed for Prolene sutures without any difficulties.  Patient tolerated procedure well. ? ? ?Final Clinical Impression(s) / ED Diagnoses ?Final diagnoses:  ?Visit for suture removal  ? ? ?Rx / DC Orders ?ED Discharge Orders   ? ? None  ? ?  ? ? ?  ?Vanetta Mulders, MD ?03/23/21 [phone] ? ?

## 2021-03-23 NOTE — Discharge Instructions (Signed)
Return as needed.  Give the wound a few days to gain additional strength since its only been a week since the injury. ?
